# Patient Record
Sex: Female | Born: 1985 | Race: Black or African American | Hispanic: No | Marital: Married | Smoking: Former smoker
Health system: Southern US, Community
[De-identification: ages and names within clinical notes are randomized; demographics above are authoritative.]

## PROBLEM LIST (undated history)

## (undated) DIAGNOSIS — R51 Headache: Secondary | ICD-10-CM

## (undated) DIAGNOSIS — R600 Localized edema: Secondary | ICD-10-CM

## (undated) DIAGNOSIS — F32A Depression, unspecified: Secondary | ICD-10-CM

## (undated) DIAGNOSIS — F329 Major depressive disorder, single episode, unspecified: Secondary | ICD-10-CM

## (undated) DIAGNOSIS — R519 Headache, unspecified: Secondary | ICD-10-CM

## (undated) DIAGNOSIS — Z8709 Personal history of other diseases of the respiratory system: Secondary | ICD-10-CM

## (undated) DIAGNOSIS — L309 Dermatitis, unspecified: Secondary | ICD-10-CM

## (undated) DIAGNOSIS — F419 Anxiety disorder, unspecified: Secondary | ICD-10-CM

## (undated) DIAGNOSIS — J45909 Unspecified asthma, uncomplicated: Secondary | ICD-10-CM

## (undated) DIAGNOSIS — I10 Essential (primary) hypertension: Secondary | ICD-10-CM

## (undated) HISTORY — DX: Essential (primary) hypertension: I10

## (undated) HISTORY — PX: TONSILLECTOMY: SUR1361

## (undated) HISTORY — PX: LEEP: SHX91

---

## 2015-04-27 ENCOUNTER — Other Ambulatory Visit (HOSPITAL_COMMUNITY): Payer: Self-pay | Admitting: Surgery

## 2015-05-26 ENCOUNTER — Ambulatory Visit (HOSPITAL_COMMUNITY): Payer: BLUE CROSS/BLUE SHIELD

## 2015-06-04 ENCOUNTER — Ambulatory Visit (HOSPITAL_COMMUNITY)
Admission: RE | Admit: 2015-06-04 | Discharge: 2015-06-04 | Disposition: A | Discharge status: Home / Self Care | Payer: BLUE CROSS/BLUE SHIELD | Source: Ambulatory Visit | Attending: Surgery | Admitting: Surgery

## 2015-06-04 DIAGNOSIS — Z6841 Body Mass Index (BMI) 40.0 and over, adult: Secondary | ICD-10-CM | POA: Insufficient documentation

## 2015-06-05 ENCOUNTER — Encounter: Payer: BLUE CROSS/BLUE SHIELD | Attending: Surgery | Admitting: Dietician

## 2015-06-05 ENCOUNTER — Encounter: Payer: Self-pay | Admitting: Dietician

## 2015-06-05 DIAGNOSIS — Z029 Encounter for administrative examinations, unspecified: Secondary | ICD-10-CM | POA: Insufficient documentation

## 2015-06-05 NOTE — Progress Notes (Signed)
  Pre-Op Assessment Visit:  Pre-Operative RYGB Surgery  Medical Nutrition Therapy:  Appt start time: 0810   End time:  0900.  Patient was seen on 06/05/2015 for Pre-Operative Nutrition Assessment. Assessment and [facility] on 06/05/2015.   Patient is concerned about recent weight gain. She reports that she is struggling with lower extremity swelling. Has an appointment to see her PCP this spring to discuss swelling and Lasix. Also recently started Depo shot after having a baby 6 months ago.   Samples provided and patient instructed on proper use: Premier protein shake (chocolate - qty 1) Lot#: [id number] Exp: 01/2016  Premier protein shake (vanilla - qty 1) Lot#: [id number] Exp: 09/2015  Preferred Learning Style:   No preference indicated   Learning Readiness:   Ready  Handouts given during visit include:  Pre-Op Goals Bariatric Surgery Protein Shakes   During the appointment today the following Pre-Op Goals were reviewed with the patient: Maintain or lose weight as instructed by your surgeon Make healthy food choices Begin to limit portion sizes Limited concentrated sugars and fried foods Keep fat/sugar in the single digits per serving on   food labels Practice CHEWING your food  (aim for 30 chews per bite or until applesauce consistency) Practice not drinking 15 minutes before, during, and 30 minutes after each meal/snack Avoid all carbonated beverages  Avoid/limit caffeinated beverages  Avoid all sugar-sweetened beverages Consume 3 meals per day; eat every 3-5 hours Make a list of non-food related activities Aim for 64-100 ounces of FLUID daily  Aim for at least 60-80 grams of PROTEIN daily Look for a liquid protein source that contain ?15 g protein and ?5 g carbohydrate  (ex: shakes, drinks, shots)  Patient-Centered Goals: -Amusement parks -Self confidence -Fitting in chairs (not being  embarrassed) -Feeling better overall -Longer, healthier life  Scale of 1-10: confidence (10) /importance(10)  Demonstrated degree of understanding via:  Teach Back  Teaching Method Utilized:  Visual Auditory Hands on  Barriers to learning/adherence to lifestyle change: none  Patient to call the [facility] to enroll in Pre-Op and Post-Op Nutrition Education when surgery date is scheduled.

## 2015-06-05 NOTE — Patient Instructions (Signed)
Follow Pre-Op Goals Try Protein Shakes Call [facility] at [phone] when surgery is scheduled to enroll in Pre-Op Class  Things to remember:  Please always be honest with us. We want to support you!  If you have any questions or concerns in between appointments, please call or email Liz, Matty Vanroekel, or Laurie.  The diet after surgery will be high protein and low in carbohydrate.  Vitamins and calcium need to be taken for the rest of your life.  Feel free to include support people in any classes or appointments.   Supplement recommendations:  "Complete" Multivitamin: Sleeve Gastrectomy and RYGB patients take a double dose of MVI. Vitamin must be liquid or chewable but not gummy. Examples of these include Flintstones Complete and Centrum Complete. If the vitamin is bariatric-specific, take 1 dose as it is already formulated for bariatric surgery patients. Examples of these are Bariatric Advantage, Celebrate, and Wellesse. These can be found at the [facility] and/or online.     Calcium citrate: 1500 mg/day of Calcium citrate (also chewable or liquid) is recommended for all procedures. The body is only able to absorb 500-600 mg of Calcium at one time so 3 daily doses of 500 mg are recommended. Calcium doses must be taken a minimum of 2 hours apart. Additionally, Calcium must be taken 2 hours apart from iron-containing MVI. Examples of brands include Celebrate, Bariatric Advantage, and Wellesse. These brands must be purchased online or at the  Outpatient Pharmacy. Citracal Petites is the only Calcium citrate supplement found in general grocery stores and pharmacies. This is in tablet form and may be recommended for patients who do not tolerate chewable Calcium.  Continued or added Vitamin D supplementation based on individual needs.    Vitamin B12: 300-500 mcg/day for Sleeve Gastrectomy and RYGB. Optional for LAGB patients as stomach remains fully intact. Must be taken  intramuscularly, sublingually, or inhaled nasally. Oral route is not recommended.  

## 2015-06-28 ENCOUNTER — Ambulatory Visit: Payer: Self-pay | Admitting: Dietician

## 2015-07-05 ENCOUNTER — Other Ambulatory Visit: Payer: Self-pay | Admitting: Surgery

## 2015-07-07 ENCOUNTER — Encounter: Payer: BLUE CROSS/BLUE SHIELD | Attending: Surgery | Admitting: Dietician

## 2015-07-07 ENCOUNTER — Other Ambulatory Visit: Payer: Self-pay | Admitting: Surgery

## 2015-07-07 ENCOUNTER — Encounter (HOSPITAL_COMMUNITY): Payer: Self-pay

## 2015-07-07 ENCOUNTER — Encounter (HOSPITAL_COMMUNITY)
Admission: RE | Admit: 2015-07-07 | Discharge: 2015-07-07 | Disposition: A | Discharge status: Home / Self Care | Payer: BLUE CROSS/BLUE SHIELD | Source: Ambulatory Visit | Attending: Surgery | Admitting: Surgery

## 2015-07-07 ENCOUNTER — Encounter: Payer: Self-pay | Admitting: Dietician

## 2015-07-07 DIAGNOSIS — Z6841 Body Mass Index (BMI) 40.0 and over, adult: Secondary | ICD-10-CM | POA: Diagnosis not present

## 2015-07-07 DIAGNOSIS — Z01812 Encounter for preprocedural laboratory examination: Secondary | ICD-10-CM | POA: Insufficient documentation

## 2015-07-07 DIAGNOSIS — K8020 Calculus of gallbladder without cholecystitis without obstruction: Secondary | ICD-10-CM | POA: Diagnosis not present

## 2015-07-07 HISTORY — DX: Anxiety disorder, unspecified: F41.9

## 2015-07-07 HISTORY — DX: Dermatitis, unspecified: L30.9

## 2015-07-07 HISTORY — DX: Depression, unspecified: F32.A

## 2015-07-07 HISTORY — DX: Headache, unspecified: R51.9

## 2015-07-07 HISTORY — DX: Headache: R51

## 2015-07-07 HISTORY — DX: Major depressive disorder, single episode, unspecified: F32.9

## 2015-07-07 HISTORY — DX: Personal history of other diseases of the respiratory system: Z87.09

## 2015-07-07 HISTORY — DX: Unspecified asthma, uncomplicated: J45.909

## 2015-07-07 HISTORY — DX: Localized edema: R60.0

## 2015-07-07 LAB — COMPREHENSIVE METABOLIC PANEL
ALBUMIN: 4.3 g/dL (ref 3.5–5.0)
ALT: 60 U/L — AB (ref 14–54)
AST: 27 U/L (ref 15–41)
Alkaline Phosphatase: 85 U/L (ref 38–126)
Anion gap: 11 (ref 5–15)
BUN: 30 mg/dL — AB (ref 6–20)
CHLORIDE: 103 mmol/L (ref 101–111)
CO2: 26 mmol/L (ref 22–32)
CREATININE: 1.16 mg/dL — AB (ref 0.44–1.00)
Calcium: 9.7 mg/dL (ref 8.9–10.3)
GFR calc Af Amer: >60 mL/min (ref >60)
GFR calc non Af Amer: >60 mL/min (ref >60)
GLUCOSE: 97 mg/dL (ref 65–99)
Potassium: 4.1 mmol/L (ref 3.5–5.1)
SODIUM: 140 mmol/L (ref 135–145)
Total Bilirubin: 0.9 mg/dL (ref 0.3–1.2)
Total Protein: 8.7 g/dL — ABNORMAL HIGH (ref 6.5–8.1)

## 2015-07-07 LAB — CBC WITH DIFFERENTIAL/PLATELET
BASOS ABS: 0 10*3/uL (ref 0.0–0.1)
Basophils Relative: 0 %
EOS ABS: 0.1 10*3/uL (ref 0.0–0.7)
EOS PCT: 1 %
HCT: 40.9 % (ref 36.0–46.0)
HEMOGLOBIN: 13.2 g/dL (ref 12.0–15.0)
Lymphocytes Relative: 31 %
Lymphs Abs: 2.3 10*3/uL (ref 0.7–4.0)
MCH: 23.8 pg — ABNORMAL LOW (ref 26.0–34.0)
MCHC: 32.3 g/dL (ref 30.0–36.0)
MCV: 73.8 fL — ABNORMAL LOW (ref 78.0–100.0)
Monocytes Absolute: 0.4 10*3/uL (ref 0.1–1.0)
Monocytes Relative: 5 %
NEUTROS PCT: 63 %
Neutro Abs: 4.7 10*3/uL (ref 1.7–7.7)
PLATELETS: 321 10*3/uL (ref 150–400)
RBC: 5.54 MIL/uL — AB (ref 3.87–5.11)
RDW: 15.1 % (ref 11.5–15.5)
WBC: 7.5 10*3/uL (ref 4.0–10.5)

## 2015-07-07 NOTE — Progress Notes (Signed)
  Pre-Operative Nutrition Class:  Appt start time: 1035   End time:  1130  Patient was seen on 07/07/2015 for Pre-Operative Bariatric Surgery Education at the [facility].   Surgery date: 07/19/2015 Surgery type: RYGB Start weight at [facility]: 390 lbs Weight today: 374.4 lbs   Samples given per MNT protocol. Patient educated on appropriate usage:  Bariatric Advantage Calcium Citrate chew (qty 2 - chocolate) Lot #: [id number] Exp: 02/2016  Bariatric Advantage Calcium Citrate chew (qty 2 - strawberry) Lot #: [id number] Exp: 03/2016  Bariatric Advantage Calcium Citrate chew (qty 2 - peanut butter chocolate) Lot #: [id number] Exp: 03/2016  Bariatric Advantage Calcium Citrate chew (qty 2 - caramel) Lot #: [id number] Exp: 02/2016    The following the learning objectives were met by the patient during this course:  Identify Pre-Op Dietary Goals and will begin 2 weeks pre-operatively  Identify appropriate sources of fluids and proteins   State protein recommendations and appropriate sources pre and post-operatively  Identify Post-Operative Dietary Goals and will follow for 2 weeks post-operatively  Identify appropriate multivitamin and calcium sources  Describe the need for physical activity post-operatively and will follow MD recommendations  State when to call healthcare provider regarding medication questions or post-operative complications  Handouts given during class include:  Pre-Op Bariatric Surgery Diet Handout  Protein Shake Handout  Post-Op Bariatric Surgery Nutrition Handout  BELT Program Information Flyer  Support Group Information Flyer  [facility] Bariatric Supplements Price List  Follow-Up Plan: Patient will follow-up at [facility] 2 weeks post operatively for diet advancement per MD.

## 2015-07-07 NOTE — Patient Instructions (Signed)
Pam BackerSara Collins  07/07/2015   Your procedure is scheduled on: Monday July 19, 2015   [facility]  Entrance [place]  elevators to 3rd floor to  Short Stay Center at 9:30 AM.  Call this number if you have problems the morning of surgery [phone]   Remember: ONLY 1 PERSON MAY GO WITH YOU TO SHORT STAY TO GET  READY MORNING OF YOUR SURGERY.  Do not eat food or drink liquids :After Midnight.     Take these medicines the morning of surgery with A SIP OF WATER: Bupropion (Wellbutrin); Citalopram (Celexa); Diazepam (Valium) if needed; Hydrocodone-Acetaminophen if needed                                You may not have any metal on your body including hair pins and              piercings  Do not wear jewelry, make-up, lotions, powders or perfumes, deodorant             Do not wear nail polish.  Do not shave  48 hours prior to surgery.              Do not bring valuables to the hospital. [facility] IS NOT             RESPONSIBLE   FOR VALUABLES.  Contacts, dentures or bridgework may not be worn into surgery.  Leave suitcase in the car. After surgery it may be brought to your room.       _____________________________________________________________________             [facility] - Preparing for [facility] Before surgery, you can play an important role.  Because skin is not sterile, your skin needs to be as free of germs as possible.  You can reduce the number of germs on your skin by washing with CHG (chlorahexidine gluconate) soap before surgery.  CHG is an antiseptic cleaner which kills germs and bonds with the skin to continue killing germs even after washing. Please DO NOT use if you have an allergy to CHG or antibacterial soaps.  If your skin becomes reddened/irritated stop using the CHG and inform your nurse when you arrive at Short Stay. Do not shave (including legs and underarms) for at least 48 hours prior to the first CHG shower.  You may shave  your face/neck. Please follow these instructions carefully:  1.  Shower with CHG Soap the night before surgery and the  morning of Surgery.  2.  If you choose to wash your hair, wash your hair first as usual with your  normal  shampoo.  3.  After you shampoo, rinse your hair and body thoroughly to remove the  shampoo.                           4.  Use CHG as you would any other liquid soap.  You can apply chg directly  to the skin and wash                       Gently with a scrungie or clean washcloth.  5.  Apply the CHG Soap to your body ONLY FROM THE NECK DOWN.   Do not use on face/ open  Wound or open sores. Avoid contact with eyes, ears mouth and genitals (private parts).                       Wash face,  Genitals (private parts) with your normal soap.             6.  Wash thoroughly, paying special attention to the area where your surgery  will be performed.  7.  Thoroughly rinse your body with warm water from the neck down.  8.  DO NOT shower/wash with your normal soap after using and rinsing off  the CHG Soap.                9.  Pat yourself dry with a clean towel.            10.  Wear clean pajamas.            11.  Place clean sheets on your bed the night of your first shower and do not  sleep with pets. Day of Surgery : Do not apply any lotions/deodorants the morning of surgery.  Please wear clean clothes to the hospital/surgery center.  FAILURE TO FOLLOW THESE INSTRUCTIONS MAY RESULT IN THE CANCELLATION OF YOUR SURGERY PATIENT SIGNATURE_________________________________  NURSE SIGNATURE__________________________________  ________________________________________________________________________

## 2015-07-08 LAB — NO BLOOD PRODUCTS

## 2015-07-08 NOTE — Progress Notes (Signed)
EKG epic 06/04/2015; also placed EKG from 01/27/2015 on chart

## 2015-07-08 NOTE — Progress Notes (Addendum)
Notified Dr Allene PyoNewman's office of blood refusal. Spoke  With Press photographerCrystal/triage nurse. Also faxed refusal form to Dr Ezzard StandingNewman.

## 2015-07-08 NOTE — Progress Notes (Signed)
Faxed blood refusal to blood bank

## 2015-07-08 NOTE — Progress Notes (Signed)
Spoke with Amy in portable equipment in regards for need for bari bed. Order placed.

## 2015-07-08 NOTE — Progress Notes (Signed)
CMP results in epic per PAT visit 07/07/2015 sent to Dr Raelyn Mora Newman

## 2015-07-18 NOTE — H&P (Signed)
Levada Dy [facility]  Location: [facility] Patient #: [id number] DOB: Jun 02, 1985 Married / Language: English / Race: White Female  History of Present Illness   Patient words: morbid obesity.   The patient is a 30 year old female who presents for a bariatric surgery evaluation.   Her PCP is Dr. Mathis Fare in [place].   She saw a surgeon - Dr. Era Bumpers in [place] for gastric bypass. But they were not approved on her insurance. She comes [facility]. She was seen there in 2016 for RYGB. She completed a 6 months supervised weight loss.  Her husband Remi Deter is with her. He is sleepy. From her standpoint, he may have been somewhat disengaged. But he asked good questions. I am going to give him the EMMI video to look at. I reviewed the surgery with her. I gave her a book on gall bladder surgery. I reviewed the operation, the recovery, and the post op course. Because she is coming from the [place], I would be likely to keep her a little longer in the hospital. I used the flip chart to show her the operation.  We also talked about limiting NSAIDs after the surgery. This may impact the treatment of her migraine headaches. She is meeting with Lawson Fiscal after the meets with me.  Her UGI on 06/04/2015 - negative. She is scheduled for surgery on 07/20/2015.  She has been on our Futures trader. She has been overweight since third grade. She has tried multiple diets including: Weight Watchers, Atkins diet, Curves, low-carb diet, calorie watching, and working out and exercise. She's tried medication such as phentermine which led to chest pain, so she stopped this. She been on Wellbutrin, which helped initially, but then did not. She says she has weakness for carbs and sweets. She has decided on the gastric bypass. She does know a few people whom have had that operation.  Per the 1991 NIH Consensus Statement, the patient  is a candidate for bariatric surgery. The patient attended an information session and reviewed the types of bariatric surgery.  The patient is interested in the Roux en Y Gastric Bypass. I discussed with the patient the indications and risks of bariatric surgery. The potential risks of surgery include, but are not limited to, bleeding, infection, leak from the bowel, DVT and PE, open surgery, long term nutrition consequences, and death. The patient understands the importance of compliance and long term follow-up with our group after surgery. She has already had much of her work up at [facility].   I discussed with the patient the indications and risks of gall bladder surgery. I gave her a copy of her [place] report. The primary risks of gall bladder surgery include, but are not limited to, bleeding, infection, common bile duct injury, and open surgery. There is also the risk that the patient may have continued symptoms after surgery.  I again gave the patient a boolklet about gall bladder surgery. Her total time for surgery will be 4 to 5 hours.   Seen by psych - Donzetta Kohut, Psy D, in [place] - 03/04/2105 She has over 100 pages of records from [place].  Past Medical History 1. Chronic back pain and knee pain  She is not actively seeing anyone at this time. 2. Migraine She gets these weekly. She said that all the women in her family get these. She also relates them to stress. When she takes the amytriptyline, they are better controlled. 3. Anxiety/depression Treated by her PCP  4. HTN 5. Jehovah's witness Refuses blood products She has left paper work with the hospital about this. She will take albumin. 6. Gall stone - 1.8 cm on US. 7. 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 BuPROPion HCl ER (XL) (300MG  Tablet ER 24HR, Oral) Active. Amitriptyline HCl (50MG  Tablet, Oral) Active. Amitriptyline HCl (25MG  Tablet, Oral) Active. Ambien (5MG  Tablet, Oral) Active. Flexeril (5MG  Tablet, Oral) Active. CeleXA (10MG  Tablet, Oral) Active. Valium (2MG  Tablet, Oral) Active. Lasix (20MG  Tablet, Oral) Active. Hydrocodone-Acetaminophen (7.5-325MG  Tablet, Oral prn) Active. TraMADol HCl (50MG  Tablet, Oral prn) Active. Ibuprofen (600MG  Tablet, Oral prn) Active. Medications Reconciled  Vitals Cyndra Numbers(Bernie Morris CMA; 07/07/2015 12:12 PM) 07/07/2015 12:12 PM Weight: 374.2 lb Height: 69in Body Surface Area: 2.7 m Body Mass Index: 55.26 kg/m  Temp.: 98.75F(Oral)  Pulse: 100 (Regular)  BP: 128/86 (Sitting, Left Arm, Standard)   Physical Exam  General: Obese WF alert and generally healthy appearing. HEENT: Normal. Pupils equal.  Neck: Supple. No mass. No thyroid mass.  Lymph Nodes: No supraclavicular or cervical nodes.  Lungs: Clear to auscultation and symmetric breath sounds. Heart: RRR. No murmur or rub.  Abdomen: Soft. No mass. No tenderness. No hernia. Normal bowel sounds. No abdominal scars.   She is about 1/2 apple and about 1/2 pear. She has a lot of weight in her thighs.  Extremities: Good strength and ROM in upper and lower extremities.   Neurologic: Grossly intact to motor and sensory function. Psychiatric: Has normal mood and affect. Behavior is normal.  Assessment & Plan  1.  MORBID OBESITY WITH BMI OF 50.0-59.9, ADULT (E66.01)  Plan:  1) She has prescriptions for oxycodone, Zofran,  and Protonix.  2) She is given the bowel prep sheet.  3) Scheduled for RYGB/cholecystectomy - 07/19/2015  2.  GALLSTONES (K80.20)  Impression: Plan to do cholecystectomy at the same time as gastric bypass 3.  REFUSAL OF BLOOD TRANSFUSIONS AS PATIENT IS JEHOVAH'S WITNESS (Z53.1)  Impression: She has given advanced directives to the hospital 4.  HISTORY OF MIGRAINE HEADACHES (Z86.69)  Impression: She gets these weekly. 5.  Chronic back pain and knee pain 6.  Anxiety/depression   Treated by her PCP 7. HTN   Ovidio Kinavid Reese Senk, MD, [facility] Pager: [phone] Office phone:  [phone]

## 2015-07-19 ENCOUNTER — Inpatient Hospital Stay (HOSPITAL_COMMUNITY): Payer: BLUE CROSS/BLUE SHIELD | Admitting: Certified Registered Nurse Anesthetist

## 2015-07-19 ENCOUNTER — Encounter (HOSPITAL_COMMUNITY)
Admission: RE | Disposition: A | Discharge status: Home / Self Care | Payer: Self-pay | Source: Ambulatory Visit | Attending: Surgery

## 2015-07-19 ENCOUNTER — Encounter (HOSPITAL_COMMUNITY): Payer: Self-pay | Admitting: *Deleted

## 2015-07-19 ENCOUNTER — Inpatient Hospital Stay (HOSPITAL_COMMUNITY): Payer: BLUE CROSS/BLUE SHIELD

## 2015-07-19 ENCOUNTER — Inpatient Hospital Stay (HOSPITAL_COMMUNITY)
Admission: RE | Admit: 2015-07-19 | Discharge: 2015-07-22 | DRG: 620 | Disposition: A | Discharge status: Home / Self Care | Payer: BLUE CROSS/BLUE SHIELD | Source: Ambulatory Visit | Attending: Surgery | Admitting: Surgery

## 2015-07-19 DIAGNOSIS — Z531 Procedure and treatment not carried out because of patient's decision for reasons of belief and group pressure: Secondary | ICD-10-CM | POA: Diagnosis present

## 2015-07-19 DIAGNOSIS — Z9884 Bariatric surgery status: Secondary | ICD-10-CM

## 2015-07-19 DIAGNOSIS — F329 Major depressive disorder, single episode, unspecified: Secondary | ICD-10-CM | POA: Diagnosis present

## 2015-07-19 DIAGNOSIS — G8929 Other chronic pain: Secondary | ICD-10-CM | POA: Diagnosis present

## 2015-07-19 DIAGNOSIS — K8010 Calculus of gallbladder with chronic cholecystitis without obstruction: Secondary | ICD-10-CM | POA: Diagnosis present

## 2015-07-19 DIAGNOSIS — M25569 Pain in unspecified knee: Secondary | ICD-10-CM | POA: Diagnosis present

## 2015-07-19 DIAGNOSIS — Z87891 Personal history of nicotine dependence: Secondary | ICD-10-CM

## 2015-07-19 DIAGNOSIS — R609 Edema, unspecified: Secondary | ICD-10-CM | POA: Diagnosis not present

## 2015-07-19 DIAGNOSIS — G43909 Migraine, unspecified, not intractable, without status migrainosus: Secondary | ICD-10-CM | POA: Diagnosis present

## 2015-07-19 DIAGNOSIS — M549 Dorsalgia, unspecified: Secondary | ICD-10-CM | POA: Diagnosis present

## 2015-07-19 DIAGNOSIS — Z6841 Body Mass Index (BMI) 40.0 and over, adult: Secondary | ICD-10-CM

## 2015-07-19 DIAGNOSIS — Z79899 Other long term (current) drug therapy: Secondary | ICD-10-CM

## 2015-07-19 DIAGNOSIS — F419 Anxiety disorder, unspecified: Secondary | ICD-10-CM | POA: Diagnosis present

## 2015-07-19 DIAGNOSIS — I10 Essential (primary) hypertension: Secondary | ICD-10-CM | POA: Diagnosis present

## 2015-07-19 DIAGNOSIS — R21 Rash and other nonspecific skin eruption: Secondary | ICD-10-CM | POA: Diagnosis present

## 2015-07-19 DIAGNOSIS — Z419 Encounter for procedure for purposes other than remedying health state, unspecified: Secondary | ICD-10-CM

## 2015-07-19 HISTORY — PX: GASTRIC ROUX-EN-Y: SHX5262

## 2015-07-19 HISTORY — PX: CHOLECYSTECTOMY: SHX55

## 2015-07-19 LAB — HEMOGLOBIN AND HEMATOCRIT, BLOOD
HEMATOCRIT: 38 % (ref 36.0–46.0)
HEMOGLOBIN: 12.3 g/dL (ref 12.0–15.0)

## 2015-07-19 LAB — PREGNANCY, URINE: PREG TEST UR: NEGATIVE

## 2015-07-19 SURGERY — LAPAROSCOPIC ROUX-EN-Y GASTRIC BYPASS WITH UPPER ENDOSCOPY
Anesthesia: General | Site: Abdomen

## 2015-07-19 MED ORDER — LACTATED RINGERS IR SOLN
Status: DC | PRN
  Administered 2015-07-19 (×4): 1000 mL

## 2015-07-19 MED ORDER — LACTATED RINGERS IV SOLN
INTRAVENOUS | Status: DC
  Administered 2015-07-19 (×3): via INTRAVENOUS

## 2015-07-19 MED ORDER — PROMETHAZINE HCL 25 MG/ML IJ SOLN
6.2500 mg | Freq: Once | INTRAMUSCULAR | Status: AC | PRN
  Administered 2015-07-19: 6.25 mg via INTRAVENOUS

## 2015-07-19 MED ORDER — ONDANSETRON HCL 4 MG/2ML IJ SOLN
INTRAMUSCULAR | Status: AC
  Filled 2015-07-19: qty 2

## 2015-07-19 MED ORDER — LACTATED RINGERS IV SOLN: INTRAVENOUS | Status: DC

## 2015-07-19 MED ORDER — CEFOTETAN DISODIUM-DEXTROSE 2-2.08 GM-% IV SOLR
INTRAVENOUS | Status: AC
  Filled 2015-07-19: qty 50

## 2015-07-19 MED ORDER — SUGAMMADEX SODIUM 200 MG/2ML IV SOLN
INTRAVENOUS | Status: DC | PRN
  Administered 2015-07-19: 500 mg via INTRAVENOUS

## 2015-07-19 MED ORDER — HEPARIN SODIUM (PORCINE) 5000 UNIT/ML IJ SOLN
5000.0000 [IU] | Freq: Three times a day (TID) | INTRAMUSCULAR | Status: DC
  Administered 2015-07-19 – 2015-07-22 (×8): 5000 [IU] via SUBCUTANEOUS
  Filled 2015-07-19 (×12): qty 1

## 2015-07-19 MED ORDER — ROCURONIUM BROMIDE 100 MG/10ML IV SOLN
INTRAVENOUS | Status: AC
  Filled 2015-07-19: qty 1

## 2015-07-19 MED ORDER — SUCCINYLCHOLINE CHLORIDE 20 MG/ML IJ SOLN
INTRAMUSCULAR | Status: DC | PRN
Start: 2015-07-19 — End: 2015-07-19
  Administered 2015-07-19: 100 mg via INTRAVENOUS

## 2015-07-19 MED ORDER — PROPOFOL 10 MG/ML IV BOLUS
INTRAVENOUS | Status: AC
  Filled 2015-07-19: qty 20

## 2015-07-19 MED ORDER — GLYCOPYRROLATE 0.2 MG/ML IJ SOLN
INTRAMUSCULAR | Status: DC | PRN
  Administered 2015-07-19: 0.2 mg via INTRAVENOUS

## 2015-07-19 MED ORDER — LIDOCAINE HCL (CARDIAC) 20 MG/ML IV SOLN
INTRAVENOUS | Status: DC | PRN
  Administered 2015-07-19: 100 mg via INTRAVENOUS

## 2015-07-19 MED ORDER — HYDROMORPHONE HCL 1 MG/ML IJ SOLN: 0.2500 mg | INTRAMUSCULAR | Status: DC | PRN

## 2015-07-19 MED ORDER — PROMETHAZINE HCL 25 MG/ML IJ SOLN
INTRAMUSCULAR | Status: AC
  Filled 2015-07-19: qty 1

## 2015-07-19 MED ORDER — MORPHINE SULFATE (PF) 2 MG/ML IV SOLN
2.0000 mg | INTRAVENOUS | Status: DC | PRN
  Administered 2015-07-19: 4 mg via INTRAVENOUS
  Administered 2015-07-19: 6 mg via INTRAVENOUS
  Administered 2015-07-19: 4 mg via INTRAVENOUS
  Administered 2015-07-19: 6 mg via INTRAVENOUS
  Administered 2015-07-19: 4 mg via INTRAVENOUS
  Administered 2015-07-20 (×10): 6 mg via INTRAVENOUS
  Administered 2015-07-21 (×3): 4 mg via INTRAVENOUS
  Administered 2015-07-21: 6 mg via INTRAVENOUS
  Administered 2015-07-21: 4 mg via INTRAVENOUS
  Administered 2015-07-21 – 2015-07-22 (×3): 6 mg via INTRAVENOUS
  Filled 2015-07-19 (×2): qty 3
  Filled 2015-07-19: qty 2
  Filled 2015-07-19 (×3): qty 3
  Filled 2015-07-19: qty 2
  Filled 2015-07-19 (×3): qty 3
  Filled 2015-07-19: qty 2
  Filled 2015-07-19 (×4): qty 3
  Filled 2015-07-19: qty 2
  Filled 2015-07-19: qty 3
  Filled 2015-07-19: qty 2
  Filled 2015-07-19 (×2): qty 3
  Filled 2015-07-19: qty 2
  Filled 2015-07-19: qty 3
  Filled 2015-07-19: qty 2
  Filled 2015-07-19: qty 3

## 2015-07-19 MED ORDER — SODIUM CHLORIDE 0.9 % IJ SOLN
INTRAMUSCULAR | Status: AC
  Filled 2015-07-19: qty 10

## 2015-07-19 MED ORDER — HYDROMORPHONE HCL 1 MG/ML IJ SOLN
INTRAMUSCULAR | Status: DC
Start: 2015-07-19 — End: 2015-07-19
  Filled 2015-07-19: qty 1

## 2015-07-19 MED ORDER — ACETAMINOPHEN 160 MG/5ML PO SOLN
650.0000 mg | ORAL | Status: DC | PRN
  Administered 2015-07-22: 650 mg via ORAL
  Filled 2015-07-19: qty 20.3

## 2015-07-19 MED ORDER — PHENYLEPHRINE HCL 10 MG/ML IJ SOLN
INTRAMUSCULAR | Status: DC | PRN
  Administered 2015-07-19: 40 ug via INTRAVENOUS

## 2015-07-19 MED ORDER — MIDAZOLAM HCL 2 MG/2ML IJ SOLN
INTRAMUSCULAR | Status: AC
  Filled 2015-07-19: qty 2

## 2015-07-19 MED ORDER — IOPAMIDOL (ISOVUE-300) INJECTION 61%
INTRAVENOUS | Status: DC | PRN
  Administered 2015-07-19: 10 mL

## 2015-07-19 MED ORDER — ACETAMINOPHEN 160 MG/5ML PO SOLN: 325.0000 mg | ORAL | Status: DC | PRN

## 2015-07-19 MED ORDER — FENTANYL CITRATE (PF) 100 MCG/2ML IJ SOLN
INTRAMUSCULAR | Status: DC | PRN
  Administered 2015-07-19 (×10): 50 ug via INTRAVENOUS

## 2015-07-19 MED ORDER — ONDANSETRON HCL 4 MG/2ML IJ SOLN: 4.0000 mg | Freq: Once | INTRAMUSCULAR | Status: DC

## 2015-07-19 MED ORDER — DEXAMETHASONE SODIUM PHOSPHATE 10 MG/ML IJ SOLN
INTRAMUSCULAR | Status: AC
  Filled 2015-07-19: qty 1

## 2015-07-19 MED ORDER — CHLORHEXIDINE GLUCONATE 4 % EX LIQD: 60.0000 mL | Freq: Once | CUTANEOUS | Status: DC

## 2015-07-19 MED ORDER — TISSEEL VH 10 ML EX KIT
PACK | CUTANEOUS | Status: AC
  Filled 2015-07-19: qty 3

## 2015-07-19 MED ORDER — PROPOFOL 10 MG/ML IV BOLUS
INTRAVENOUS | Status: DC | PRN
  Administered 2015-07-19: 200 mg via INTRAVENOUS

## 2015-07-19 MED ORDER — FENTANYL CITRATE (PF) 100 MCG/2ML IJ SOLN
INTRAMUSCULAR | Status: AC
  Filled 2015-07-19: qty 2

## 2015-07-19 MED ORDER — ONDANSETRON HCL 4 MG/2ML IJ SOLN
4.0000 mg | INTRAMUSCULAR | Status: DC | PRN
  Administered 2015-07-19 – 2015-07-21 (×6): 4 mg via INTRAVENOUS
  Filled 2015-07-19 (×8): qty 2

## 2015-07-19 MED ORDER — DEXAMETHASONE SODIUM PHOSPHATE 10 MG/ML IJ SOLN
INTRAMUSCULAR | Status: DC | PRN
  Administered 2015-07-19: 10 mg via INTRAVENOUS

## 2015-07-19 MED ORDER — LIDOCAINE HCL (CARDIAC) 20 MG/ML IV SOLN
INTRAVENOUS | Status: AC
  Filled 2015-07-19: qty 5

## 2015-07-19 MED ORDER — OXYCODONE HCL 5 MG/5ML PO SOLN
5.0000 mg | ORAL | Status: DC | PRN
  Administered 2015-07-20 – 2015-07-22 (×5): 10 mg via ORAL
  Filled 2015-07-19 (×5): qty 10

## 2015-07-19 MED ORDER — SODIUM CHLORIDE 0.9 % IJ SOLN
INTRAMUSCULAR | Status: AC
Start: 2015-07-19 — End: 2015-07-19
  Filled 2015-07-19: qty 50

## 2015-07-19 MED ORDER — EPHEDRINE SULFATE 50 MG/ML IJ SOLN
INTRAMUSCULAR | Status: AC
  Filled 2015-07-19: qty 1

## 2015-07-19 MED ORDER — BUPIVACAINE HCL (PF) 0.25 % IJ SOLN
INTRAMUSCULAR | Status: AC
Start: 2015-07-19 — End: 2015-07-19
  Filled 2015-07-19: qty 30

## 2015-07-19 MED ORDER — PHENYLEPHRINE 40 MCG/ML (10ML) SYRINGE FOR IV PUSH (FOR BLOOD PRESSURE SUPPORT)
PREFILLED_SYRINGE | INTRAVENOUS | Status: AC
  Filled 2015-07-19: qty 10

## 2015-07-19 MED ORDER — ACETAMINOPHEN 10 MG/ML IV SOLN
INTRAVENOUS | Status: AC
  Filled 2015-07-19: qty 100

## 2015-07-19 MED ORDER — SUGAMMADEX SODIUM 500 MG/5ML IV SOLN
INTRAVENOUS | Status: AC
  Filled 2015-07-19: qty 5

## 2015-07-19 MED ORDER — FENTANYL CITRATE (PF) 250 MCG/5ML IJ SOLN
INTRAMUSCULAR | Status: AC
  Filled 2015-07-19: qty 5

## 2015-07-19 MED ORDER — HYDROMORPHONE HCL 1 MG/ML IJ SOLN
INTRAMUSCULAR | Status: DC | PRN
Start: 2015-07-19 — End: 2015-07-19
  Administered 2015-07-19: .2 mg via INTRAVENOUS
  Administered 2015-07-19: .4 mg via INTRAVENOUS
  Administered 2015-07-19: .2 mg via INTRAVENOUS
  Administered 2015-07-19 (×3): .4 mg via INTRAVENOUS

## 2015-07-19 MED ORDER — TISSEEL VH 10 ML EX KIT
PACK | CUTANEOUS | Status: DC | PRN
  Administered 2015-07-19: 3 via TOPICAL

## 2015-07-19 MED ORDER — IOPAMIDOL (ISOVUE-300) INJECTION 61%
INTRAVENOUS | Status: AC
  Filled 2015-07-19: qty 50

## 2015-07-19 MED ORDER — ONDANSETRON HCL 4 MG/2ML IJ SOLN
INTRAMUSCULAR | Status: DC | PRN
  Administered 2015-07-19: 4 mg via INTRAVENOUS

## 2015-07-19 MED ORDER — CEFOTETAN DISODIUM-DEXTROSE 2-2.08 GM-% IV SOLR
2.0000 g | INTRAVENOUS | Status: AC
  Administered 2015-07-19: 2 g via INTRAVENOUS

## 2015-07-19 MED ORDER — HEPARIN SODIUM (PORCINE) 5000 UNIT/ML IJ SOLN
5000.0000 [IU] | INTRAMUSCULAR | Status: AC
  Administered 2015-07-19: 5000 [IU] via SUBCUTANEOUS
  Filled 2015-07-19: qty 1

## 2015-07-19 MED ORDER — POTASSIUM CHLORIDE IN NACL 20-0.45 MEQ/L-% IV SOLN
INTRAVENOUS | Status: DC
  Administered 2015-07-19 – 2015-07-21 (×5): via INTRAVENOUS
  Filled 2015-07-19 (×12): qty 1000

## 2015-07-19 MED ORDER — ACETAMINOPHEN 10 MG/ML IV SOLN
1000.0000 mg | Freq: Once | INTRAVENOUS | Status: AC
  Administered 2015-07-19: 1000 mg via INTRAVENOUS

## 2015-07-19 MED ORDER — PROMETHAZINE HCL 25 MG/ML IJ SOLN
6.2500 mg | Freq: Once | INTRAMUSCULAR | Status: AC
  Administered 2015-07-19: 6.25 mg via INTRAVENOUS

## 2015-07-19 MED ORDER — PREMIER PROTEIN SHAKE
2.0000 [oz_av] | Freq: Four times a day (QID) | ORAL | Status: DC
  Administered 2015-07-21 – 2015-07-22 (×6): 2 [oz_av] via ORAL

## 2015-07-19 MED ORDER — ROCURONIUM BROMIDE 100 MG/10ML IV SOLN
INTRAVENOUS | Status: DC | PRN
  Administered 2015-07-19 (×2): 10 mg via INTRAVENOUS
  Administered 2015-07-19: 40 mg via INTRAVENOUS
  Administered 2015-07-19 (×2): 10 mg via INTRAVENOUS
  Administered 2015-07-19: 20 mg via INTRAVENOUS
  Administered 2015-07-19 (×3): 10 mg via INTRAVENOUS

## 2015-07-19 MED ORDER — 0.9 % SODIUM CHLORIDE (POUR BTL) OPTIME
TOPICAL | Status: DC | PRN
  Administered 2015-07-19: 1000 mL

## 2015-07-19 MED ORDER — MIDAZOLAM HCL 5 MG/5ML IJ SOLN
INTRAMUSCULAR | Status: DC | PRN
  Administered 2015-07-19: 2 mg via INTRAVENOUS
  Administered 2015-07-19: 1 mg via INTRAVENOUS

## 2015-07-19 MED ORDER — GLYCOPYRROLATE 0.2 MG/ML IJ SOLN
INTRAMUSCULAR | Status: AC
  Filled 2015-07-19: qty 1

## 2015-07-19 MED ORDER — HYDROMORPHONE HCL 2 MG/ML IJ SOLN
INTRAMUSCULAR | Status: AC
  Filled 2015-07-19: qty 1

## 2015-07-19 SURGICAL SUPPLY — 93 items
APPLIER CLIP 5 13 M/L LIGAMAX5 (MISCELLANEOUS)
APPLIER CLIP ROT 10 11.4 M/L (STAPLE)
APPLIER CLIP ROT 13.4 12 LRG (CLIP)
BENZOIN TINCTURE PRP APPL 2/3 (GAUZE/BANDAGES/DRESSINGS) ×3 IMPLANT
BLADE SURG 15 STRL LF DISP TIS (BLADE) ×1 IMPLANT
BLADE SURG 15 STRL SS (BLADE) ×2
CABLE HIGH FREQUENCY MONO STRZ (ELECTRODE) ×3 IMPLANT
CHLORAPREP W/TINT 26ML (MISCELLANEOUS) ×6 IMPLANT
CHOLANGIOGRAM CATH TAUT (CATHETERS) ×3 IMPLANT
CLIP APPLIE 5 13 M/L LIGAMAX5 (MISCELLANEOUS) IMPLANT
CLIP APPLIE ROT 10 11.4 M/L (STAPLE) IMPLANT
CLIP APPLIE ROT 13.4 12 LRG (CLIP) IMPLANT
CLIP SUT LAPRA TY ABSORB (SUTURE) ×6 IMPLANT
CLOSURE WOUND 1/4X4 (GAUZE/BANDAGES/DRESSINGS) ×1
CONT SPEC 4OZ CLIKSEAL STRL BL (MISCELLANEOUS) ×6 IMPLANT
COVER MAYO STAND STRL (DRAPES) IMPLANT
COVER SURGICAL LIGHT HANDLE (MISCELLANEOUS) ×3 IMPLANT
DECANTER SPIKE VIAL GLASS SM (MISCELLANEOUS) ×3 IMPLANT
DEVICE SUT QUICK LOAD TK 5 (STAPLE) IMPLANT
DEVICE SUT TI-KNOT TK 5X26 (MISCELLANEOUS) IMPLANT
DEVICE SUTURE ENDOST 10MM (ENDOMECHANICALS) ×3 IMPLANT
DEVICE TI KNOT TK5 (MISCELLANEOUS)
DISSECTOR BLUNT TIP ENDO 5MM (MISCELLANEOUS) IMPLANT
DRAIN PENROSE 18X1/4 LTX STRL (WOUND CARE) ×3 IMPLANT
DRAPE C-ARM 42X120 X-RAY (DRAPES) IMPLANT
DRAPE LAPAROSCOPIC ABDOMINAL (DRAPES) ×3 IMPLANT
ELECT REM PT RETURN 9FT ADLT (ELECTROSURGICAL) ×3
ELECTRODE REM PT RTRN 9FT ADLT (ELECTROSURGICAL) ×1 IMPLANT
GAUZE SPONGE 4X4 16PLY XRAY LF (GAUZE/BANDAGES/DRESSINGS) ×3 IMPLANT
GLOVE SURG SIGNA 7.5 PF LTX (GLOVE) ×3 IMPLANT
GOWN STRL REUS W/TWL XL LVL3 (GOWN DISPOSABLE) ×12 IMPLANT
HEMOSTAT SURGICEL 4X8 (HEMOSTASIS) IMPLANT
HOVERMATT SINGLE USE (MISCELLANEOUS) ×3 IMPLANT
IV CATH 14GX2 1/4 (CATHETERS) ×3 IMPLANT
IV SET EXTENSION CATH 6 NF (IV SETS) ×3 IMPLANT
KIT BASIN OR (CUSTOM PROCEDURE TRAY) ×3 IMPLANT
KIT GASTRIC LAVAGE 34FR ADT (SET/KITS/TRAYS/PACK) ×3 IMPLANT
LIQUID BAND (GAUZE/BANDAGES/DRESSINGS) ×3 IMPLANT
LUBRICANT JELLY K Y 4OZ (MISCELLANEOUS) ×3 IMPLANT
MARKER SKIN DUAL TIP RULER LAB (MISCELLANEOUS) ×3 IMPLANT
NEEDLE SPNL 22GX3.5 QUINCKE BK (NEEDLE) ×3 IMPLANT
PACK CARDIOVASCULAR III (CUSTOM PROCEDURE TRAY) ×3 IMPLANT
POSITIONER SURGICAL ARM (MISCELLANEOUS) IMPLANT
POUCH SPECIMEN RETRIEVAL 10MM (ENDOMECHANICALS) IMPLANT
QUICK LOAD TK 5 (STAPLE)
RELOAD 45 VASCULAR/THIN (ENDOMECHANICALS) ×6 IMPLANT
RELOAD ENDO STITCH 2.0 (ENDOMECHANICALS) ×22
RELOAD GOLD ECHELON 45 (STAPLE) ×6 IMPLANT
RELOAD STAPLE TA45 3.5 REG BLU (ENDOMECHANICALS) ×12 IMPLANT
RELOAD STAPLER BLUE 60MM (STAPLE) ×2 IMPLANT
RELOAD STAPLER GOLD 60MM (STAPLE) ×1 IMPLANT
RELOAD STAPLER WHITE 60MM (STAPLE) IMPLANT
SCISSORS LAP 5X35 DISP (ENDOMECHANICALS) ×3 IMPLANT
SEALANT SURGICAL APPL DUAL CAN (MISCELLANEOUS) ×3 IMPLANT
SET IRRIG TUBING LAPAROSCOPIC (IRRIGATION / IRRIGATOR) ×3 IMPLANT
SHEARS HARMONIC ACE PLUS 36CM (ENDOMECHANICALS) ×3 IMPLANT
SLEEVE ADV FIXATION 12X100MM (TROCAR) IMPLANT
SLEEVE SURGEON STRL (DRAPES) ×3 IMPLANT
SLEEVE XCEL OPT CAN 5 100 (ENDOMECHANICALS) ×3 IMPLANT
SOLUTION ANTI FOG 6CC (MISCELLANEOUS) ×3 IMPLANT
STAPLE ECHEON FLEX 60 POW ENDO (STAPLE) ×3 IMPLANT
STAPLER RELOAD BLUE 60MM (STAPLE) ×6
STAPLER RELOAD GOLD 60MM (STAPLE) ×3
STAPLER RELOAD WHITE 60MM (STAPLE)
STOPCOCK 4 WAY LG BORE MALE ST (IV SETS) ×3 IMPLANT
STRIP CLOSURE SKIN 1/4X4 (GAUZE/BANDAGES/DRESSINGS) ×2 IMPLANT
SUT MNCRL AB 4-0 PS2 18 (SUTURE) ×6 IMPLANT
SUT RELOAD ENDO STITCH 2 48X1 (ENDOMECHANICALS) ×7
SUT RELOAD ENDO STITCH 2.0 (ENDOMECHANICALS) ×4
SUT SURGIDAC NAB ES-9 0 48 120 (SUTURE) IMPLANT
SUT VIC AB 2-0 SH 27 (SUTURE) ×2
SUT VIC AB 2-0 SH 27X BRD (SUTURE) ×1 IMPLANT
SUTURE RELOAD END STTCH 2 48X1 (ENDOMECHANICALS) ×7 IMPLANT
SUTURE RELOAD ENDO STITCH 2.0 (ENDOMECHANICALS) ×4 IMPLANT
SYR 10ML ECCENTRIC (SYRINGE) ×3 IMPLANT
SYR 20CC LL (SYRINGE) ×6 IMPLANT
SYR 50ML LL SCALE MARK (SYRINGE) ×3 IMPLANT
TAPE CLOTH 4X10 WHT NS (GAUZE/BANDAGES/DRESSINGS) IMPLANT
TOWEL OR 17X26 10 PK STRL BLUE (TOWEL DISPOSABLE) ×3 IMPLANT
TRAY FOLEY W/METER SILVER 14FR (SET/KITS/TRAYS/PACK) ×3 IMPLANT
TRAY LAPAROSCOPIC (CUSTOM PROCEDURE TRAY) ×3 IMPLANT
TROCAR ADV FIXATION 11X100MM (TROCAR) IMPLANT
TROCAR ADV FIXATION 12X100MM (TROCAR) IMPLANT
TROCAR ADV FIXATION 5X100MM (TROCAR) IMPLANT
TROCAR BLADELESS OPT 5 100 (ENDOMECHANICALS) ×3 IMPLANT
TROCAR UNIVERSAL OPT 12M 100M (ENDOMECHANICALS) IMPLANT
TROCAR XCEL 12X100 BLDLESS (ENDOMECHANICALS) IMPLANT
TROCAR XCEL BLUNT TIP 100MML (ENDOMECHANICALS) ×3 IMPLANT
TROCAR XCEL NON-BLD 11X100MML (ENDOMECHANICALS) ×3 IMPLANT
TUBING CONNECTING 10 (TUBING) IMPLANT
TUBING CONNECTING 10' (TUBING)
TUBING ENDO SMARTCAP (MISCELLANEOUS) ×3 IMPLANT
TUBING INSUF HEATED (TUBING) ×3 IMPLANT

## 2015-07-19 NOTE — Op Note (Signed)
PATIENT:   Pam Collins DOB:   Jul 30, 1985 MRN:   [id number]  DATE OF PROCEDURE: 07/19/2015                   FACILITY:  [facility]  OPERATIVE REPORT  PREOPERATIVE DIAGNOSIS:  Morbid obesity, cholelithiasis  POSTOPERATIVE DIAGNOSIS:  Morbid obesity (weight 374, BMI of 55.3), cholelithiasis.  PROCEDURE:  Laparoscopic Roux-en-Y gastric bypass, antecolic, antegastric (intraoperative upper endoscopy by Dr. Fredonia Highland), laparoscopic cholecystectomy with cholangiogram  SURGEON:  Sandria Bales. Ezzard Standing, MD  FIRST ASSISTANTFredonia Highland, MD  ANESTHESIA:  General endotracheal.  Anesthesiologist: Sharee Holster, MD CRNA: Wynonia Sours, CRNA; Delphia Grates, CRNA  General  ESTIMATED BLOOD LOSS:  Minimal.  LOCAL ANESTHESIA:  30 cc of 1/4% Marcaine  COMPLICATIONS:  None.  INDICATION FOR SURGERY:  Pam Collins is a 30 y.o. white  female who sees AUSTIN, ANN E, DO as her primary care doctor.  She has completed our preoperative bariatric program and now comes for a laparoscopic Roux-en-Y gastric bypass.  On her pre op [place], she has gall stone.  I discussed with her about going ahead with a cholecystectomy at the same time as the gastric bypass.  The indications, potential complications of surgery were explained to the patient.  Potential complications of the surgery include, but are not limited to, bleeding, infection, DVT, open surgery, and long-term nutritional consequences.  OPERATIVE NOTE:  The patient taken to room #4 at [facility] where Ms. Sealy underwent a general endotracheal anesthetic, supervised by Anesthesiologist: Sharee Holster, MD CRNA: Wynonia Sours, CRNA; Delphia Grates, CRNA.  The patient was given 2 g of cefotetan at the beginning of the procedure.  A time-out was held and surgical checklist run.  The abdomen was prepped with ChloraPrep and sterilely draped.  I accessed the abdominal cavity through the left upper quadrant using a 12 mm Optiview trocar.  I placed 6 additional trocars: 5 mm  subxiphoid, 12 mm right subcostal, 12 mm right paramedian, 12 mm left paramedian, 5 mm lateral subcostal, and a 11 mm below to the right of the umbilicus.  The abdomen was insufflated and abdominal exploration carried out.  Right and left lobes of liver unremarkable.  The stomach that I could see was unremarkable.  The patient had a moderate amount of greater omentum which draped over the bowel.  I was able to push the omentum and transverse colon up and identified the ligament of Treitz to start the operation.  I measured 40 cm of the jejunum, starting at the ligament of Tritz, and divided the jejunum with a white load of 45 mm Ethicon Endo-GIA stapler.  I divided a short length into the mesentery.  I measured 100 cm of jejunum for the future gastric limb.  I put a Penrose drain on the future gastric limb of the jejunum.  I then did a side-to-side jejunojejunostomy.  I used a 45 mm white load of the Ethicon Endo-GIA stapler.  I closed the enterotomy with 2 running 2-0 Vicryl sutures.  I tested the JJ anastomosis with an alligator forceps and then covered this with Tisseel.  I closed the mesenteric defect with a running 2-0 silk suture with a Laparo-tye on each end.  I then divided the omentum with a Harmonic Scalpel.  I positioned the patient in reverse Trendelenburg and placed the liver retractor, which was introduced into the peritoneal cavity through a subxiphoid 5 mm trocar puncture, under the left lobe of the liver.  I then identified  the gastroesophageal junction.  I went to the left at the angle of His and made a window at the left side of the esophago-gastric junction for a target as my dissection.  I then went on the lesser curve of the stomach, measured 5 cm from the gastroesophageal junction down the lesser curve and dissected into the lesser sac from the lesser curvature side of the stomach.  I did the first firing of a 60 mm Gold load Ethicon Eschelon stapler and then did 3 firings of  the 60 mm blue load Ethicon Eschelon stapler.  I had to do 2 additional firings of a 45 mm Ethicon Endo GIA stapler to complete divide the stomach.  This created a gastric pouch approximately 5 cm in length and 3 cm in width.  There was no bleeding from either the pouch or the stomach remnant site.  I placed Tisseel on the pouch side along the new greater curvature.  I over sewed the gastric remnant with a locking 2-0 Vicryl suture with a Laparo-tye on each end..  I then brought the jejunum ante-colic, ante-gastric up to the new stomach pouch and placed a posterior running 2-0 Vicryl suture.  I then made an enterotomy into the stomach using the Ewald as a back stop and an enterotomy into the jejunum.  I did a stapled side-to-side gastrojejunal anastomosis using these two enterotomies with a 45 mm blue load of the Ethicon Endo GIA stapler.  I tried to create a 2.5 cm gastrojejunal anastomosis.  I closed the enterotomy with a 2 running 2-0 Vicryl sutures.  I passed the Ewald tube through the gastrojejunal anastomosis and then did an anterior Connell suture running of 2-0 Vicryl suture for the anterior layer of the gastrojejunostomy.  She did a fairly long afferent limb of jejunum at the gastrojejunal anastomosis.  The Ewald tube was then removed without difficulty.  I then closed the [place] defect with a figure-of-eight 2-0 silk suture between the mesentery of the transverse colon and the mesentery of the distal jejunum.  Dr. Andrey Campanile then scrubbed out and did an intraoperative upper endoscopy.  He identified the esophagogastric junction about 40 cm, the gastrojejunal anastomosis about 45 cm.  I clamped off the small bowel.  He insufflated air and I flooded the abdomen with saline. There was no bubbling or evidence of air leak.  He then withdrew the scope and he will dictate that portion of the operation.    I then re-inspected the anastomoses, sucked out the saline, placed Tisseel over the stomach pouch  and gastrojejunal anastomosis.   The liver retractor was removed.     I then started the gall bladder part of the operation.  Dr. Andrey Campanile placed a right lateral 5 mm trocar to grab the gall bladder, otherwise, I was able to use the puncture sites already created.  The gall bladder was not inflamed.   I grasped the gall bladder and rotated it cephalad.  Disssection was carried down to the gall bladder/cystic duct junction and the cystic duct isolated.  A clip was placed on the gall bladder side of the cystic duct.   An intra-operative cholangiogram was shot.  The intra-operative cholangiogram was shot using a cut off Taut catheter placed through a 14 gauge angiocath in the RUQ.  The Taut catheter was inserted in the cut cystic duct and secured with an endoclip.  A cholangiogram was shot with 10 cc of 1/2 strength Omnipaque.  Using fluoroscopy, the cholangiogram showed the flow  of contrast into the common bile duct, up the hepatic radicals, and into the duodenum.  There was no mass or obstruction.  This was a normal intra-operative cholangiogram.  The Taut catheter was removed.  The cystic duct was tripley endoclipped and the cystic artery was identified and clipped.  The gall bladder was bluntly and sharpley dissected from the gall bladder bed.  After the gall bladder was removed from the liver, the gall bladder bed and Triangle of [place] were inspected.  There was no bleeding or bile leak.  The gall bladder was placed in a endocatch bag and delivered through the RUQ trocar.    The abdomen was irrigated with 3,000 cc saline  The trocars were removed.  There was no bleeding at any trocar site.  The skin at each trocar site was closed with a 4-0 Monocryl suture.  I infiltrated a total about 30 cc of 0.25% Marcaine at the trocar sites.    After the skin incisions were closed with sutures they were painted with LiquidBand.  The sponge and needle count were correct at the end of the case.  The patient  tolerated the procedure well, was transported to the recovery room in good condition.   Ovidio Kinavid Kaliq Lege, MD, [facility] Pager: [phone] Office phone:  [phone]

## 2015-07-19 NOTE — Anesthesia Postprocedure Evaluation (Signed)
Anesthesia Post Note  Patient: Pam Collins  Procedure(s) Performed: Procedure(s) (LRB): LAPAROSCOPIC ROUX-EN-Y GASTRIC BYPASS WITH UPPER ENDOSCOPY (N/A) LAPAROSCOPIC CHOLECYSTECTOMY WITH INTRAOPERATIVE CHOLANGIOGRAM (N/A)  Patient location during evaluation: PACU Anesthesia Type: General Level of consciousness: awake and alert Pain management: pain level controlled Vital Signs Assessment: post-procedure vital signs reviewed and stable Respiratory status: spontaneous breathing, nonlabored ventilation, respiratory function stable and patient connected to nasal cannula oxygen Cardiovascular status: blood pressure returned to baseline and stable Postop Assessment: no signs of nausea or vomiting Anesthetic complications: no    Last Vitals:  Filed Vitals:   07/19/15 1630 07/19/15 1649  BP: 131/82 149/77  Pulse: 114 114  Temp: 36.8 C 36.9 C  Resp: 22 22    Last Pain:  Filed Vitals:   07/19/15 1649  PainSc: Asleep                 Leesha Veno,JAMES TERRILL

## 2015-07-19 NOTE — Anesthesia Procedure Notes (Addendum)
Procedure Name: Intubation Date/Time: 07/19/2015 11:27 AM Performed by: Wynonia SoursWALKER, Siarra Gilkerson L Pre-anesthesia Checklist: Patient identified, Emergency Drugs available, Suction available, Patient being monitored and Timeout performed Patient Re-evaluated:Patient Re-evaluated prior to inductionOxygen Delivery Method: Circle system utilized Preoxygenation: Pre-oxygenation with 100% oxygen Intubation Type: IV induction Ventilation: Mask ventilation without difficulty Laryngoscope Size: Mac and 4 Grade View: Grade I Tube type: Oral Tube size: 7.0 mm Number of attempts: 1 Airway Equipment and Method: Stylet Placement Confirmation: ETT inserted through vocal cords under direct vision,  positive ETCO2,  CO2 detector and breath sounds checked- equal and bilateral Secured at: 22 cm Tube secured with: Tape Dental Injury: Teeth and Oropharynx as per pre-operative assessment  Comments: During preop assessment patient has chipped front tooth in which she stated that a filling fell out.

## 2015-07-19 NOTE — Anesthesia Preprocedure Evaluation (Addendum)
Anesthesia Evaluation  Patient identified by MRN, date of birth, ID band Patient awake    Reviewed: Allergy & Precautions, NPO status , Patient's Chart, lab work & pertinent test results  Airway Mallampati: II  TM Distance: >3 FB Neck ROM: Full    Dental  (+) Teeth Intact, Poor Dentition, Dental Advisory Given,    Pulmonary asthma , former smoker,    breath sounds clear to auscultation       Cardiovascular hypertension,  Rhythm:Regular Rate:Normal     Neuro/Psych  Headaches,    GI/Hepatic negative GI ROS, Neg liver ROS,   Endo/Other  Morbid obesity  Renal/GU negative Renal ROS     Musculoskeletal   Abdominal (+) + obese,   Peds  Hematology Pt is Jehovah's witness and  Refuses blood products   Anesthesia Other Findings   Reproductive/Obstetrics                            Anesthesia Physical Anesthesia Plan  ASA: III  Anesthesia Plan: General   Post-op Pain Management:    Induction: Intravenous  Airway Management Planned: Oral ETT  Additional Equipment:   Intra-op Plan:   Post-operative Plan: Extubation in OR  Informed Consent:   Dental advisory given  Plan Discussed with:   Anesthesia Plan Comments: (Noted Rica Koyanagijehovahs' witness)        Anesthesia Quick Evaluation

## 2015-07-19 NOTE — Interval H&P Note (Signed)
History and Physical Interval Note:  07/19/2015 10:58 AM  Pam BackerSara Shrader  has presented today for surgery, with the diagnosis of MORBID OBESITY  The various methods of treatment have been discussed with the patient and family. Her husband and mother are here with her. After consideration of risks, benefits and other options for treatment, the patient has consented to  Procedure(s): LAPAROSCOPIC ROUX-EN-Y GASTRIC BYPASS WITH UPPER ENDOSCOPY (N/A) LAPAROSCOPIC CHOLECYSTECTOMY WITH INTRAOPERATIVE CHOLANGIOGRAM (N/A) as a surgical intervention .  The patient's history has been reviewed, patient examined, no change in status, stable for surgery.  I have reviewed the patient's chart and labs.  Questions were answered to the patient's satisfaction.     Marshaun Lortie H

## 2015-07-19 NOTE — Op Note (Signed)
Ciro BackerSara Estrella [id number] [birth year] 07/19/2015  Preoperative diagnosis: morbid obesity  Postoperative diagnosis: Same   Procedure: Upper endoscopy   Surgeon: Atilano InaEric M Karia Ehresman M.D., FACS   Anesthesia: Gen.   Indications for procedure: 30 y.o. yo female undergoing a laparoscopic roux en y gastric bypass and an upper endoscopy was requested to evaluate the anastomosis.  Description of procedure: After we have completed the new gastrojejunostomy, I scrubbed out and obtained the Olympus endoscope. I gently placed endoscope in the patient's oropharynx and gently glided it down the esophagus without any difficulty under direct visualization. Once I was in the gastric pouch, I insufflated the pouch was air. The pouch was approximately 4 cm (z line at 40cm, anastomosis at 44cm) in size. I was able to cannulate and advanced the scope through the gastrojejunostomy. Dr.Newman had placed saline in the upper abdomen. Upon further insufflation of the gastric pouch there was no evidence of bubbles. Upon further inspection of the gastric pouch, the mucosa appeared normal. There is no evidence of any mucosal abnormality. The gastric pouch and Roux limb were decompressed. The width of the gastrojejunal anastomosis was at least 2.5 cm. The scope was withdrawn. The patient tolerated this portion of the procedure well. Please see Dr Allene PyoNewman's operative note for details regarding the laparoscopic roux-en-y gastric bypass.  Mary SellaEric M. Andrey CampanileWilson, MD, FACS General, Bariatric, & Minimally Invasive Surgery [facility], [place]

## 2015-07-19 NOTE — Transfer of Care (Signed)
Immediate Anesthesia Transfer of Care Note  Patient: Ciro BackerSara Luginbill  Procedure(s) Performed: Procedure(s): LAPAROSCOPIC ROUX-EN-Y GASTRIC BYPASS WITH UPPER ENDOSCOPY (N/A) LAPAROSCOPIC CHOLECYSTECTOMY WITH INTRAOPERATIVE CHOLANGIOGRAM (N/A)  Patient Location: PACU  Anesthesia Type:General  Level of Consciousness:  sedated, patient cooperative and responds to stimulation  Airway & Oxygen Therapy:Patient Spontanous Breathing and Patient connected to face mask oxgen  Post-op Assessment:  Report given to PACU RN and Post -op Vital signs reviewed and stable  Post vital signs:  Reviewed and stable  Last Vitals:  Filed Vitals:   07/19/15 1540 07/19/15 1545  BP: 156/94   Pulse: 124 121  Temp:    Resp:  20    Complications: No apparent anesthesia complications

## 2015-07-20 ENCOUNTER — Inpatient Hospital Stay (HOSPITAL_COMMUNITY): Payer: BLUE CROSS/BLUE SHIELD

## 2015-07-20 DIAGNOSIS — R609 Edema, unspecified: Secondary | ICD-10-CM

## 2015-07-20 LAB — CBC WITH DIFFERENTIAL/PLATELET
BASOS ABS: 0 10*3/uL (ref 0.0–0.1)
BASOS PCT: 0 %
Eosinophils Absolute: 0 10*3/uL (ref 0.0–0.7)
Eosinophils Relative: 0 %
HEMATOCRIT: 34.7 % — AB (ref 36.0–46.0)
HEMOGLOBIN: 11.4 g/dL — AB (ref 12.0–15.0)
LYMPHS PCT: 18 %
Lymphs Abs: 2 10*3/uL (ref 0.7–4.0)
MCH: 23.7 pg — ABNORMAL LOW (ref 26.0–34.0)
MCHC: 32.9 g/dL (ref 30.0–36.0)
MCV: 72.1 fL — AB (ref 78.0–100.0)
Monocytes Absolute: 0.5 10*3/uL (ref 0.1–1.0)
Monocytes Relative: 5 %
NEUTROS ABS: 8.5 10*3/uL — AB (ref 1.7–7.7)
NEUTROS PCT: 77 %
Platelets: 277 10*3/uL (ref 150–400)
RBC: 4.81 MIL/uL (ref 3.87–5.11)
RDW: 15.5 % (ref 11.5–15.5)
WBC: 11 10*3/uL — ABNORMAL HIGH (ref 4.0–10.5)

## 2015-07-20 LAB — HEMOGLOBIN AND HEMATOCRIT, BLOOD
HEMATOCRIT: 36.7 % (ref 36.0–46.0)
Hemoglobin: 11.9 g/dL — ABNORMAL LOW (ref 12.0–15.0)

## 2015-07-20 MED ORDER — DIATRIZOATE MEGLUMINE & SODIUM 66-10 % PO SOLN
50.0000 mL | Freq: Once | ORAL | Status: DC
  Filled 2015-07-20: qty 60

## 2015-07-20 MED ORDER — ZOLPIDEM TARTRATE 5 MG PO TABS
5.0000 mg | ORAL_TABLET | Freq: Every evening | ORAL | Status: DC | PRN
  Administered 2015-07-20 – 2015-07-21 (×2): 5 mg via ORAL
  Filled 2015-07-20 (×2): qty 1

## 2015-07-20 MED ORDER — DIAZEPAM 5 MG/ML IJ SOLN
5.0000 mg | Freq: Two times a day (BID) | INTRAMUSCULAR | Status: DC | PRN
  Administered 2015-07-20 – 2015-07-21 (×3): 5 mg via INTRAVENOUS
  Filled 2015-07-20 (×3): qty 2

## 2015-07-20 MED ORDER — CITALOPRAM HYDROBROMIDE 20 MG PO TABS
20.0000 mg | ORAL_TABLET | Freq: Every day | ORAL | Status: DC
  Administered 2015-07-20 – 2015-07-22 (×3): 20 mg via ORAL
  Filled 2015-07-20 (×3): qty 1

## 2015-07-20 MED ORDER — BUPROPION HCL ER (XL) 300 MG PO TB24
300.0000 mg | ORAL_TABLET | Freq: Every day | ORAL | Status: DC
  Administered 2015-07-20 – 2015-07-22 (×3): 300 mg via ORAL
  Filled 2015-07-20 (×3): qty 1

## 2015-07-20 MED ORDER — AMITRIPTYLINE HCL 50 MG PO TABS
50.0000 mg | ORAL_TABLET | Freq: Every day | ORAL | Status: DC
  Administered 2015-07-20 – 2015-07-21 (×2): 50 mg via ORAL
  Filled 2015-07-20 (×4): qty 1

## 2015-07-20 NOTE — Progress Notes (Signed)
[facility] Office:  [phone] General Surgery Progress Note   LOS: 1 day  POD -  1 Day Post-Op  Assessment/Plan: 1.  LAPAROSCOPIC ROUX-EN-Y GASTRIC BYPASS WITH UPPER ENDOSCOPY, LAPAROSCOPIC CHOLECYSTECTOMY WITH INTRAOPERATIVE CHOLANGIOGRAM - 07/19/2015 - D. Donnella Morford  Morbid obesity - Weight - 374, BMI - 55.3  Doing okay so far - for swallow today  2. REFUSAL OF BLOOD TRANSFUSIONS AS PATIENT IS JEHOVAH'S WITNESS (Z53.1) Impression: She has given advanced directives to the hospital 3. HISTORY OF MIGRAINE HEADACHES (Z86.69) Impression: She gets these weekly. 4. Chronic back pain and knee pain 5. Anxiety/depression  Treated by her PCP 6. HTN 7.  DVT prophylaxis - SQ Heparin 8.  Rash left lower leg - she was scratching it last night   Active Problems:   Morbid obesity (HCC)  Subjective:  Thirsty, but no nausea.  Walked a lot last PM.  Her mother and husband are at the bedside.  Objective:   Filed Vitals:   07/20/15 0226 07/20/15 0604  BP: 137/80 145/80  Pulse: 83 77  Temp: 97.9 F (36.6 C) 97.4 F (36.3 C)  Resp: 18 16     Intake/Output from previous day:  04/17 0701 - 04/18 0700 In: 3895 [I.V.:3895] Out: 2065 [Urine:2015; Blood:50]  Intake/Output this shift:      Physical Exam:   General: Obese WF who is alert and oriented.    HEENT: Normal. Pupils equal. .   Lungs: Clear   Abdomen: Soft.  Has a few BS.   Wound: Clean.   Left lower leg - superficial rash anteriorly, nothing on right side.   Lab Results:    Recent Labs  07/19/15 1750 07/20/15 0431  WBC  --  11.0*  HGB 12.3 11.4*  HCT 38.0 34.7*  PLT  --  277    BMET  No results for input(s): NA, K, CL, CO2, GLUCOSE, BUN, CREATININE, CALCIUM in the last 72 hours.  PT/INR  No results for input(s): LABPROT, INR in the last 72 hours.  ABG  No results for input(s): PHART, HCO3 in the last 72 hours.  Invalid input(s): PCO2,  PO2   Studies/Results:  Dg Cholangiogram Operative  07/19/2015  CLINICAL DATA:  Intraoperative cholangiogram during laparoscopic cholecystectomy. EXAM: INTRAOPERATIVE CHOLANGIOGRAM FLUOROSCOPY TIME:  15 seconds (7.47 mGy) COMPARISON:  None FINDINGS: Intraoperative cholangiographic images of the right upper abdominal quadrant during laparoscopic cholecystectomy are provided for review. Surgical clips overlie the expected location of the gallbladder fossa. Contrast injection demonstrates selective cannulation of the central aspect of the cystic duct. There is passage of contrast through the central aspect of the cystic duct with filling of a non dilated common bile duct. There is passage of contrast though the CBD and into the descending portion of the duodenum. There is minimal reflux of injected contrast into the common hepatic duct and central aspect of the non dilated intrahepatic biliary system. There is minimal opacification of central aspect of the pancreatic duct which appears nondilated. There are no discrete filling defects within the opacified portions of the biliary system to suggest the presence of choledocholithiasis, however no, the distal aspect of the CBD is obscured secondary to overlying radiopaque surgical support apparatus. IMPRESSION: No definite evidence of choledocholithiasis. Electronically Signed   By: Simonne ComeJohn  Watts M.D.   On: 07/19/2015 15:14     Anti-infectives:   Anti-infectives    Start     Dose/Rate Route Frequency Ordered Stop   07/19/15 0939  cefoTEtan in Dextrose 5% (CEFOTAN) IVPB 2  g     2 g Intravenous On call to O.R. 07/19/15 0939 07/19/15 1147      Ovidio Kin, MD, FACS Pager: [phone] Surgery Office: [phone] 07/20/2015

## 2015-07-20 NOTE — Progress Notes (Signed)
Patient alert and oriented, Post op day 1.  Provided support and encouragement.  Encouraged pulmonary toilet, ambulation and small sips of liquids.  All questions answered.  Will continue to monitor. 

## 2015-07-20 NOTE — Progress Notes (Addendum)
*  Preliminary Results* Bilateral lower extremity venous duplex completed. Visualized veins of bilateral lower extremities are negative for deep vein thrombosis. There is no evidence of Baker's cyst bilaterally.  Incidental finding: There is evidence of a heterogenous area of the right groin measuring 1.1cm, suggestive of a possible inguinal lymph node.  07/20/2015  Gertie FeyMichelle Callyn Severtson, RVT, RDCS, RDMS

## 2015-07-20 NOTE — Progress Notes (Signed)
Reviewed pt requested home medications via phone with Dr. Johna SheriffHoxworth. MD aware pt passed UGI and tolerating water q4h as well as ice chips. See new orders received.

## 2015-07-20 NOTE — Plan of Care (Signed)
Problem: Food- and Nutrition-Related Knowledge Deficit (NB-1.1) Goal: Nutrition education Formal process to instruct or train a patient/client in a skill or to impart knowledge to help patients/clients voluntarily manage or modify food choices and eating behavior to maintain or improve health. Outcome: Completed/Met Date Met:  07/20/15 Nutrition Education Note  Received consult for diet education per DROP protocol.   Discussed 2 week post op diet with pt. Emphasized that liquids must be non carbonated, non caffeinated, and sugar free. Fluid goals discussed. Reviewed progression of diet to include soft proteins at 7-10 days post-op. Pt to follow up with outpatient bariatric RD for further diet progression after 2 weeks. Multivitamins and minerals also reviewed. Teach back method used, pt expressed understanding, expect good compliance.   Diet: First 2 Weeks  You will see the dietitian about two (2) weeks after your surgery. The dietitian will increase the types of foods you can eat if you are handling liquids well:  If you have severe vomiting or nausea and cannot handle clear liquids lasting longer than 1 day, call your surgeon  Protein Shake  Drink at least 2 ounces of shake 5-6 times per day  Each serving of protein shakes (usually 8 - 12 ounces) should have a minimum of:  15 grams of protein  And no more than 5 grams of carbohydrate  Goal for protein each day:  Men = 80 grams per day  Women = 60 grams per day  Protein powder may be added to fluids such as non-fat milk or Lactaid milk or Soy milk (limit to 35 grams added protein powder per serving)   Hydration  Slowly increase the amount of water and other clear liquids as tolerated (See Acceptable Fluids)  Slowly increase the amount of protein shake as tolerated  Sip fluids slowly and throughout the day  May use sugar substitutes in small amounts (no more than 6 - 8 packets per day; i.e. Splenda)   Fluid Goal  The first goal is to  drink at least 8 ounces of protein shake/drink per day (or as directed by the nutritionist); some examples of protein shakes are Syntrax Nectar, Adkins Advantage, EAS Edge HP, and Unjury. See handout from pre-op Bariatric Education Class:  Slowly increase the amount of protein shake you drink as tolerated  You may find it easier to slowly sip shakes throughout the day  It is important to get your proteins in first  Your fluid goal is to drink 64 - 100 ounces of fluid daily  It may take a few weeks to build up to this  32 oz (or more) should be clear liquids  And  32 oz (or more) should be full liquids (see below for examples)  Liquids should not contain sugar, caffeine, or carbonation   Clear Liquids:  Water or Sugar-free flavored water (i.e. Fruit H2O, Propel)  Decaffeinated coffee or tea (sugar-free)  Crystal Lite, Wyler's Lite, Minute Maid Lite  Sugar-free Jell-O  Bouillon or broth  Sugar-free Popsicle: *Less than 20 calories each; Limit 1 per day   Full Liquids:  Protein Shakes/Drinks + 2 choices per day of other full liquids  Full liquids must be:  No More Than 12 grams of Carbs per serving  No More Than 3 grams of Fat per serving  Strained low-fat cream soup  Non-Fat milk  Fat-free Lactaid Milk  Sugar-free yogurt (Dannon Lite & Fit, Greek yogurt)     Tuwanna Krausz, MS, RD, LDN Pager: [phone] After Hours Pager: [phone]        

## 2015-07-20 NOTE — Progress Notes (Signed)
Patient requested for by mouth medications which are amitriptyline and ambien. Dr Ezzard StandingNewman notified.  Patient remains NPO.

## 2015-07-21 LAB — CBC WITH DIFFERENTIAL/PLATELET
BASOS ABS: 0 10*3/uL (ref 0.0–0.1)
BASOS PCT: 0 %
Eosinophils Absolute: 0.1 10*3/uL (ref 0.0–0.7)
Eosinophils Relative: 1 %
HEMATOCRIT: 38.6 % (ref 36.0–46.0)
HEMOGLOBIN: 12.2 g/dL (ref 12.0–15.0)
Lymphocytes Relative: 38 %
Lymphs Abs: 3.8 10*3/uL (ref 0.7–4.0)
MCH: 23.8 pg — ABNORMAL LOW (ref 26.0–34.0)
MCHC: 31.6 g/dL (ref 30.0–36.0)
MCV: 75.4 fL — ABNORMAL LOW (ref 78.0–100.0)
MONO ABS: 0.5 10*3/uL (ref 0.1–1.0)
Monocytes Relative: 5 %
NEUTROS ABS: 5.7 10*3/uL (ref 1.7–7.7)
NEUTROS PCT: 56 %
Platelets: 305 10*3/uL (ref 150–400)
RBC: 5.12 MIL/uL — ABNORMAL HIGH (ref 3.87–5.11)
RDW: 16.2 % — AB (ref 11.5–15.5)
WBC: 10.1 10*3/uL (ref 4.0–10.5)

## 2015-07-21 NOTE — Progress Notes (Signed)
[facility]:  [phone] General Surgery Progress Note   LOS: 2 days  POD -  2 Days Post-Op  Assessment/Plan: 1.  LAPAROSCOPIC ROUX-EN-Y GASTRIC BYPASS WITH UPPER ENDOSCOPY, LAPAROSCOPIC CHOLECYSTECTOMY WITH INTRAOPERATIVE CHOLANGIOGRAM - 07/19/2015 - D. Analyse Angst  Morbid obesity - Weight - 374, BMI - 55.3  Swallow okay - taking water.  To advance diet.  2. REFUSAL OF BLOOD TRANSFUSIONS AS PATIENT IS JEHOVAH'S WITNESS (Z53.1) Impression: She has given advanced directives to the hospital 3. HISTORY OF MIGRAINE HEADACHES (Z86.69) Impression: She gets these weekly. 4. Chronic back pain and knee pain 5. Anxiety/depression  Treated by her PCP 6. HTN 7.  DVT prophylaxis - SQ Heparin 8.  Rash left lower leg - she was scratching it last night   Active Problems:   Morbid obesity (HCC)  Subjective:  Tolerated water.  Mobilizing well.  Husband at bedside.  Objective:   Filed Vitals:   07/21/15 0142 07/21/15 0552  BP: 117/58 142/61  Pulse: 92 90  Temp: 98.8 F (37.1 C) 98.9 F (37.2 C)  Resp: 18 18     Intake/Output from previous day:  04/18 0701 - 04/19 0700 In: 1230 [P.O.:30; I.V.:1200] Out: 1550 [Urine:1550]  Intake/Output this shift:      Physical Exam:   General: Obese WF who is alert and oriented.    HEENT: Normal. Pupils equal. .   Lungs: Clear   Abdomen: Soft.  Has BS.   Wound: Clean.   Left lower leg - superficial rash anteriorly, nothing on right side.   Lab Results:     Recent Labs  07/20/15 0431 07/20/15 1553 07/21/15 0421  WBC 11.0*  --  10.1  HGB 11.4* 11.9* 12.2  HCT 34.7* 36.7 38.6  PLT 277  --  305    BMET  No results for input(s): NA, K, CL, CO2, GLUCOSE, BUN, CREATININE, CALCIUM in the last 72 hours.  PT/INR  No results for input(s): LABPROT, INR in the last 72 hours.  ABG  No results for input(s): PHART, HCO3 in the last 72 hours.  Invalid input(s): PCO2,  PO2   Studies/Results:  Dg Cholangiogram Operative  07/19/2015  CLINICAL DATA:  Intraoperative cholangiogram during laparoscopic cholecystectomy. EXAM: INTRAOPERATIVE CHOLANGIOGRAM FLUOROSCOPY TIME:  15 seconds (7.47 mGy) COMPARISON:  None FINDINGS: Intraoperative cholangiographic images of the right upper abdominal quadrant during laparoscopic cholecystectomy are provided for review. Surgical clips overlie the expected location of the gallbladder fossa. Contrast injection demonstrates selective cannulation of the central aspect of the cystic duct. There is passage of contrast through the central aspect of the cystic duct with filling of a non dilated common bile duct. There is passage of contrast though the CBD and into the descending portion of the duodenum. There is minimal reflux of injected contrast into the common hepatic duct and central aspect of the non dilated intrahepatic biliary system. There is minimal opacification of central aspect of the pancreatic duct which appears nondilated. There are no discrete filling defects within the opacified portions of the biliary system to suggest the presence of choledocholithiasis, however no, the distal aspect of the CBD is obscured secondary to overlying radiopaque surgical support apparatus. IMPRESSION: No definite evidence of choledocholithiasis. Electronically Signed   By: Simonne ComeJohn  Watts M.D.   On: 07/19/2015 15:14   Dg Kayleen MemosUgi W/water Sol Cm  07/20/2015  CLINICAL DATA:  30 year old female post gastric bypass. Initial encounter. EXAM: WATER SOLUBLE UPPER GI SERIES TECHNIQUE: Single-column upper GI series was performed using water soluble contrast.  CONTRAST:  50 cc Gastroview COMPARISON:  06/04/2015 FLUOROSCOPY TIME:  Radiation Exposure Index (as provided by the fluoroscopic device): 258.3 micro Gray Fluoroscopy Time:  42 seconds. FINDINGS: Post gastric bypass procedure. No leak identified. No delayed emptying. IMPRESSION: Post gastric bypass procedure. No leak  identified. No delayed emptying. Electronically Signed   By: Lacy Duverney M.D.   On: 07/20/2015 10:09     Anti-infectives:   Anti-infectives    Start     Dose/Rate Route Frequency Ordered Stop   07/19/15 0939  cefoTEtan in Dextrose 5% (CEFOTAN) IVPB 2 g     2 g Intravenous On call to O.R. 07/19/15 0939 07/19/15 1147      Ovidio Kin, MD, FACS Pager: [phone] [facility]: [phone] 07/21/2015

## 2015-07-21 NOTE — Progress Notes (Signed)
Patient alert and oriented, Post op day 2.  Provided support and encouragement.  Encouraged pulmonary toilet, ambulation and small sips of liquids.  All questions answered.  Will continue to monitor. 

## 2015-07-22 NOTE — Discharge Summary (Addendum)
Physician Discharge Summary  Patient ID:  Pam Collins  MRN: [id number]  DOB/AGE: 1985-10-14 30 y.o.  Admit date: 07/19/2015 Discharge date: 07/22/2015  Discharge Diagnoses:  1.  Morbid obesity - Weight - 374, BMI - 55.3 2.  Chronic cholecystitis  3. REFUSAL OF BLOOD TRANSFUSIONS AS PATIENT IS JEHOVAH'S WITNESS (Z53.1) Impression: She has given advanced directives to the hospital 4. HISTORY OF MIGRAINE HEADACHES (Z86.69) Impression: She gets these weekly. 5. Chronic back pain and knee pain 6. Anxiety/depression  Treated by her PCP 7. HTN 8. Rash left lower leg - she was scratching it last night   Active Problems:   Morbid obesity (HCC)   Operation: Procedure(s): LAPAROSCOPIC ROUX-EN-Y GASTRIC BYPASS WITH UPPER ENDOSCOPY LAPAROSCOPIC CHOLECYSTECTOMY WITH INTRAOPERATIVE CHOLANGIOGRAM on 07/19/2015 - D. Ezzard StandingNewman  Discharged Condition: good  Hospital Course: Pam BackerSara Stanislawski is an 30 y.o. female whose primary care physician is AUSTIN, ANN E, DO and who was admitted 07/19/2015 with a chief complaint of morbid obesity and gallstones.  She was brought to the operating room on 07/19/2015 and underwent  LAPAROSCOPIC ROUX-EN-Y GASTRIC BYPASS WITH UPPER ENDOSCOPY LAPAROSCOPIC CHOLECYSTECTOMY WITH INTRAOPERATIVE CHOLANGIOGRAM.   On the first post op day, she had a UGI swallow which showed a normal pouch.  Dopplers of her LE were negative for DVT. She is now 3 days post op, on protein drinks and doing well. Her husband is at the bedside. She is ready for discharge. The discharge instructions were reviewed with the patient.  Consults: None  Significant Diagnostic Studies: Results for orders placed or performed during the hospital encounter of 07/19/15  Pregnancy, urine STAT morning of surgery  Result Value Ref Range   Preg Test, Ur NEGATIVE NEGATIVE  Hemoglobin and hematocrit, blood  Result Value Ref Range   Hemoglobin 12.3 12.0 - 15.0 g/dL   HCT  78.238.0 95.636.0 - 21.346.0 %  CBC WITH DIFFERENTIAL  Result Value Ref Range   WBC 11.0 (H) 4.0 - 10.5 K/uL   RBC 4.81 3.87 - 5.11 MIL/uL   Hemoglobin 11.4 (L) 12.0 - 15.0 g/dL   HCT 08.634.7 (L) 57.836.0 - 46.946.0 %   MCV 72.1 (L) 78.0 - 100.0 fL   MCH 23.7 (L) 26.0 - 34.0 pg   MCHC 32.9 30.0 - 36.0 g/dL   RDW 62.915.5 52.811.5 - 41.315.5 %   Platelets 277 150 - 400 K/uL   Neutrophils Relative % 77 %   Neutro Abs 8.5 (H) 1.7 - 7.7 K/uL   Lymphocytes Relative 18 %   Lymphs Abs 2.0 0.7 - 4.0 K/uL   Monocytes Relative 5 %   Monocytes Absolute 0.5 0.1 - 1.0 K/uL   Eosinophils Relative 0 %   Eosinophils Absolute 0.0 0.0 - 0.7 K/uL   Basophils Relative 0 %   Basophils Absolute 0.0 0.0 - 0.1 K/uL  Hemoglobin and hematocrit, blood  Result Value Ref Range   Hemoglobin 11.9 (L) 12.0 - 15.0 g/dL   HCT 24.436.7 01.036.0 - 27.246.0 %  CBC with Differential  Result Value Ref Range   WBC 10.1 4.0 - 10.5 K/uL   RBC 5.12 (H) 3.87 - 5.11 MIL/uL   Hemoglobin 12.2 12.0 - 15.0 g/dL   HCT 53.638.6 64.436.0 - 03.446.0 %   MCV 75.4 (L) 78.0 - 100.0 fL   MCH 23.8 (L) 26.0 - 34.0 pg   MCHC 31.6 30.0 - 36.0 g/dL   RDW 74.216.2 (H) 59.511.5 - 63.815.5 %   Platelets 305 150 - 400 K/uL   Neutrophils Relative % 56 %  Neutro Abs 5.7 1.7 - 7.7 K/uL   Lymphocytes Relative 38 %   Lymphs Abs 3.8 0.7 - 4.0 K/uL   Monocytes Relative 5 %   Monocytes Absolute 0.5 0.1 - 1.0 K/uL   Eosinophils Relative 1 %   Eosinophils Absolute 0.1 0.0 - 0.7 K/uL   Basophils Relative 0 %   Basophils Absolute 0.0 0.0 - 0.1 K/uL    Dg Cholangiogram Operative  07/19/2015  CLINICAL DATA:  Intraoperative cholangiogram during laparoscopic cholecystectomy. EXAM: INTRAOPERATIVE CHOLANGIOGRAM FLUOROSCOPY TIME:  15 seconds (7.47 mGy) COMPARISON:  None FINDINGS: Intraoperative cholangiographic images of the right upper abdominal quadrant during laparoscopic cholecystectomy are provided for review. Surgical clips overlie the expected location of the gallbladder fossa. Contrast injection demonstrates  selective cannulation of the central aspect of the cystic duct. There is passage of contrast through the central aspect of the cystic duct with filling of a non dilated common bile duct. There is passage of contrast though the CBD and into the descending portion of the duodenum. There is minimal reflux of injected contrast into the common hepatic duct and central aspect of the non dilated intrahepatic biliary system. There is minimal opacification of central aspect of the pancreatic duct which appears nondilated. There are no discrete filling defects within the opacified portions of the biliary system to suggest the presence of choledocholithiasis, however no, the distal aspect of the CBD is obscured secondary to overlying radiopaque surgical support apparatus. IMPRESSION: No definite evidence of choledocholithiasis. Electronically Signed   By: Simonne Come M.D.   On: 07/19/2015 15:14   Dg Kayleen Memos W/water Sol Cm  07/20/2015  CLINICAL DATA:  30 year old female post gastric bypass. Initial encounter. EXAM: WATER SOLUBLE UPPER GI SERIES TECHNIQUE: Single-column upper GI series was performed using water soluble contrast. CONTRAST:  50 cc Gastroview COMPARISON:  06/04/2015 FLUOROSCOPY TIME:  Radiation Exposure Index (as provided by the fluoroscopic device): 258.3 micro Gray Fluoroscopy Time:  42 seconds. FINDINGS: Post gastric bypass procedure. No leak identified. No delayed emptying. IMPRESSION: Post gastric bypass procedure. No leak identified. No delayed emptying. Electronically Signed   By: Lacy Duverney M.D.   On: 07/20/2015 10:09    Discharge Exam:  Filed Vitals:   07/22/15 0207 07/22/15 0614  BP: 114/64 150/83  Pulse: 99 96  Temp: 98.4 F (36.9 C) 98.3 F (36.8 C)  Resp: 17 18    General: Obese WF who is alert and generally healthy appearing.  Lungs: Clear to auscultation and symmetric breath sounds. Heart:  RRR. No murmur or rub. Abdomen: Soft. No hernia. Normal bowel sounds. Incisions look  good.  Discharge Medications:     Medication List    TAKE these medications        amitriptyline 50 MG tablet  Commonly known as:  ELAVIL  Take 50 mg by mouth at bedtime.     buPROPion 300 MG 24 hr tablet  Commonly known as:  WELLBUTRIN XL  Take 300 mg by mouth every morning.  Notes to Patient:  This medication may be too large to swallow and cannot be crushed or cut in half.  Please discuss with prescribing doctor the need to change to an immediate release formula if you are unable to swallow     citalopram 20 MG tablet  Commonly known as:  CELEXA  Take 20 mg by mouth every morning.     cyclobenzaprine 10 MG tablet  Commonly known as:  FLEXERIL  Take 10 mg by mouth 3 (three) times daily as  needed for muscle spasms.     diazepam 5 MG tablet  Commonly known as:  VALIUM  Take 5 mg by mouth 2 (two) times daily.     diclofenac 75 MG EC tablet  Commonly known as:  VOLTAREN  Take 75 mg by mouth every morning.  Notes to Patient:  Avoid NSAIDs for 6-8 weeks after surgery      furosemide 20 MG tablet  Commonly known as:  LASIX  Take 20 mg by mouth every morning.  Notes to Patient:  Monitor Blood Pressure Daily and keep a log for primary care physician.  Monitor for symptoms of dehydration.  You may need to make changes to your medications with rapid weight loss.        HYDROcodone-acetaminophen 7.5-325 MG tablet  Commonly known as:  NORCO  Take 1 tablet by mouth every 6 (six) hours as needed for moderate pain.     ibuprofen 600 MG tablet  Commonly known as:  ADVIL,MOTRIN  Take 600 mg by mouth every 6 (six) hours as needed (Pain).  Notes to Patient:  Avoid NSAIDs for 6-8 weeks after surgery      medroxyPROGESTERone 150 MG/ML injection  Commonly known as:  DEPO-PROVERA  150 mg every 3 (three) months.     traMADol 50 MG tablet  Commonly known as:  ULTRAM  Take 50 mg by mouth 2 (two) times daily as needed (Pain).     zolpidem 10 MG tablet  Commonly known as:  AMBIEN   Take 10 mg by mouth at bedtime.        Disposition: Final discharge disposition not confirmed      Discharge Instructions    Ambulate hourly while awake    Complete by:  As directed      Call MD for:  difficulty breathing, headache or visual disturbances    Complete by:  As directed      Call MD for:  persistant dizziness or light-headedness    Complete by:  As directed      Call MD for:  persistant nausea and vomiting    Complete by:  As directed      Call MD for:  redness, tenderness, or signs of infection (pain, swelling, redness, odor or green/yellow discharge around incision site)    Complete by:  As directed      Call MD for:  severe uncontrolled pain    Complete by:  As directed      Call MD for:  temperature >101 F    Complete by:  As directed      Diet bariatric full liquid    Complete by:  As directed      Incentive spirometry    Complete by:  As directed   Perform hourly while awake           Follow-up Information    Follow up with [facility] H, MD. Go on 08/05/2015.   Specialty:  General Surgery   Why:  For Post-Op Check at 4:20 PM   Contact information:   [address] [place] [address] [phone]       Follow up with [facility] H, MD. Go on 09/02/2015.   Specialty:  General Surgery   Why:  For Post-Op Check at 4:40 PM   Contact information:   [address] [place] [address] [phone]       Return to work on:  08/22/2015  Activity:  Driving - May drive in  3 or 4 days, if doing well and off pain meds   Lifting - Take it easy for one week, then no limit  Wound Care:   May shower  Diet:  Post op gastric bypass  Follow up appointment:  Call Dr. Allene Pyo office [facility]) at [phone] for an appointment in 2 weeks - you already have an appt.  Medications and dosages:  Resume your home medications.  You have a prescription for:  Oxycodone, protonix, and zofran  Signed: Ovidio Kin, M.D.,  [facility] Office:  [phone]  07/22/2015, 7:28 AM

## 2015-07-22 NOTE — Progress Notes (Signed)
Patient alert and oriented. Discharge teaching complete. Pain controlled on oral medications. All questions answered.

## 2015-07-22 NOTE — Discharge Instructions (Signed)
° ° ° °GASTRIC BYPASS/SLEEVE ° Home Care Instructions ° ° These instructions are to help you care for yourself when you go home. ° °Call: If you have any problems. °• Call [phone] and ask for the surgeon on call °• If you need immediate assistance come to the ER at [place]. Tell the ER staff you are a new post-op gastric bypass or gastric sleeve patient  °Signs and symptoms to report: • Severe  vomiting or nausea °o If you cannot handle clear liquids for longer than 1 day, call your surgeon °• Abdominal pain which does not get better after taking your pain medication °• Fever greater than 100.4°  F and chills °• Heart rate over 100 beats a minute °• Trouble breathing °• Chest pain °• Redness,  swelling, drainage, or foul odor at incision (surgical) sites °• If your incisions open or pull apart °• Swelling or pain in calf (lower leg) °• Diarrhea (Loose bowel movements that happen often), frequent watery, uncontrolled bowel movements °• Constipation, (no bowel movements for 3 days) if this happens: °o Take Milk of Magnesia, 2 tablespoons by mouth, 3 times a day for 2 days if needed °o Stop taking Milk of Magnesia once you have had a bowel movement °o Call your doctor if constipation continues °Or °o Take Miralax  (instead of Milk of Magnesia) following the label instructions °o Stop taking Miralax once you have had a bowel movement °o Call your doctor if constipation continues °• Anything you think is “abnormal for you” °  °Normal side effects after surgery: • Unable to sleep at night or unable to concentrate °• Irritability °• Being tearful (crying) or depressed ° °These are common complaints, possibly related to your anesthesia, stress of surgery, and change in lifestyle, that usually go away a few weeks after surgery. If these feelings continue, call your medical doctor.  °Wound Care: You may have surgical glue, steri-strips, or staples over your incisions after surgery °• Surgical glue: Looks like clear  film over your incisions and will wear off a little at a time °• Steri-strips: Adhesive strips of tape over your incisions. You may notice a yellowish color on skin under the steri-strips. This is used to make the steri-strips stick better. Do not pull the steri-strips off - let them fall off °• Staples: Staples may be removed before you leave the hospital °o If you go home with staples, call [facility] for an appointment with your surgeon’s nurse to have staples removed 10 days after surgery, [phone] °• Showering: You may shower two (2) days after your surgery unless your surgeon tells you differently °o Wash gently around incisions with warm soapy water, rinse well, and gently pat dry °o If you have a drain (tube from your incision), you may need someone to hold this while you shower °o No tub baths until staples are removed and incisions are healed °  °Medications: • Medications should be liquid or crushed if larger than the size of a dime °• Extended release pills (medication that releases a little bit at a time through the  day) should not be crushed °• Depending on the size and number of medications you take, you may need to space (take a few throughout the day)/change the time you take your medications so that you do not over-fill your pouch (smaller stomach) °• Make sure you follow-up with you primary care physician to make medication changes needed during rapid weight loss and life -style changes °•   If you have diabetes, follow up with your doctor that orders your diabetes medication(s) within one week after surgery and check your blood sugar regularly ° °• Do not drive while taking narcotics (pain medications) ° °• Do not take acetaminophen (Tylenol) and Roxicet or Lortab Elixir at the same time since these pain medications contain acetaminophen °  °Diet:  °First 2 Weeks You will see the nutritionist about two (2) weeks after your surgery. The nutritionist will increase the types of  foods you can eat if you are handling liquids well: °• If you have severe vomiting or nausea and cannot handle clear liquids lasting longer than 1 day call your surgeon °Protein Shake °• Drink at least 2 ounces of shake 5-6 times per day °• Each serving of protein shakes (usually 8-12 ounces) should have a minimum of: °o 15 grams of protein °o And no more than 5 grams of carbohydrate °• Goal for protein each day: °o Men = 80 grams per day °o Women = 60 grams per day °  ° • Protein powder may be added to fluids such as non-fat milk or Lactaid milk or Soy milk (limit to 35 grams added protein powder per serving) ° °Hydration °• Slowly increase the amount of water and other clear liquids as tolerated (See Acceptable Fluids) °• Slowly increase the amount of protein shake as tolerated °• Sip fluids slowly and throughout the day °• May use sugar substitutes in small amounts (no more than 6-8 packets per day; i.e. Splenda) ° °Fluid Goal °• The first goal is to drink at least 8 ounces of protein shake/drink per day (or as directed by the nutritionist); some examples of protein shakes are Syntrax Nectar, Adkins Advantage, EAS Edge HP, and Unjury. - See handout from pre-op Bariatric Education Class: °o Slowly increase the amount of protein shake you drink as tolerated °o You may find it easier to slowly sip shakes throughout the day °o It is important to get your proteins in first °• Your fluid goal is to drink 64-100 ounces of fluid daily °o It may take a few weeks to build up to this  °• 32 oz. (or more) should be clear liquids °And °• 32 oz. (or more) should be full liquids (see below for examples) °• Liquids should not contain sugar, caffeine, or carbonation ° °Clear Liquids: °• Water of Sugar-free flavored water (i.e. Fruit H²O, Propel) °• Decaffeinated coffee or tea (sugar-free) °• Crystal lite, Wyler’s Lite, Minute Maid Lite °• Sugar-free Jell-O °• Bouillon or broth °• Sugar-free Popsicle:    - Less than 20 calories  each; Limit 1 per day ° °Full Liquids: °                  Protein Shakes/Drinks + 2 choices per day of other full liquids °• Full liquids must be: °o No More Than 12 grams of Carbs per serving °o No More Than 3 grams of Fat per serving °• Strained low-fat cream soup °• Non-Fat milk °• Fat-free Lactaid Milk °• Sugar-free yogurt (Dannon Lite & Fit, Greek yogurt) ° °  °Vitamins and Minerals • Start 1 day after surgery unless otherwise directed by your surgeon °• 2 Chewable Multivitamin / Multimineral Supplement with iron (i.e. Centrum for Adults) °• Vitamin B-12, 350-500 micrograms sub-lingual (place tablet under the tongue) each day °• Chewable Calcium Citrate with Vitamin D-3 °(Example: 3 Chewable Calcium  Plus 600 with Vitamin D-3) °o Take 500 mg three (3) times a day for   a total of 1500 mg each day °o Do not take all 3 doses of calcium at one time as it may cause constipation, and you can only absorb 500 mg at a time °o Do not mix multivitamins containing iron with calcium supplements;  take 2 hours apart °o Do not substitute Tums (calcium carbonate) for your calcium °• Menstruating women and those at risk for anemia ( a blood disease that causes weakness) may need extra iron °o Talk to your doctor to see if you need more iron °• If you need extra iron: Total daily Iron recommendation (including Vitamins) is 50 to 100 mg Iron/day °• Do not stop taking or change any vitamins or minerals until you talk to your nutritionist or surgeon °• Your nutritionist and/or surgeon must approve all vitamin and mineral supplements °  °Activity and Exercise: It is important to continue walking at home. Limit your physical activity as instructed by your doctor. During this time, use these guidelines: °• Do not lift anything greater than ten  (10) pounds for at least two (2) weeks °• Do not go back to work or drive until your surgeon says you can °• You may have sex when you feel comfortable °o It is VERY important for female  patients to use a reliable birth control method; fertility often increase after surgery °o Do not get pregnant for at least 18 months °• Start exercising as soon as your doctor tells you that you can °o Make sure your doctor approves any physical activity °• Start with a simple walking program °• Walk 5-15 minutes each day, 7 days per week °• Slowly increase until you are walking 30-45 minutes per day °• Consider joining our BELT program. [phone] or email [email or web address] °  °Special Instructions Things to remember: °• Free counseling is available for you and your family through collaboration between [facility] and [employer]. Please call [phone] and leave a message °• Use your CPAP when sleeping if this applies to you °• Consider buying a medical alert bracelet that says you had lap-band surgery °  °  You will likely have your first fill (fluid added to your band) 6 - 8 weeks after surgery °• [facility] has a free Bariatric Surgery Support Group that meets monthly, the 3rd Thursday, 6pm. [facility] Classrooms. You can see classes online at [email or web address] °• It is very important to keep all follow up appointments with your surgeon, nutritionist, primary care physician, and behavioral health practitioner °o After the first year, please follow up with your bariatric surgeon and nutritionist at least once a year in order to maintain best weight loss results °      °             [facility]:  [phone] ° °             [facility]: [phone] ° °             Bariatric Nurse Coordinator: [phone]  °Gastric Bypass/Sleeve Home Care Instructions  Rev. 05/2012    ° °                                                    Reviewed and Endorsed °                                                     by [facility] Patient Education Committee, Jan, 2014    [facility] - DISCHARGE INSTRUCTIONS TO PATIENT  Return  to work on:  08/22/2015  Activity:  Driving - May drive in 3 or 4 days, if doing well and off pain meds   Lifting - Take it easy for one week, then no limit  Wound Care:   May shower  Diet:  Post op gastric bypass  Follow up appointment:  Call Dr. Allene PyoNewman's office [facility]) at [phone] for an appointment in 2 weeks - you already have an appt.  Medications and dosages:  Resume your home medications.  You have a prescription for:  Oxycodone, protonix, and zofran  Call Dr. Ezzard StandingNewman or his office  [phone]) if you have:  Temperature greater than 100.4,  Persistent nausea and vomiting,  Severe uncontrolled pain,  Redness, tenderness, or signs of infection (pain, swelling, redness, odor or green/yellow discharge around the site),  Difficulty breathing, headache or visual disturbances,  Any other questions or concerns you may have after discharge.  In an emergency, call 911 or go to an Emergency Department at a nearby hospital.

## 2015-08-02 ENCOUNTER — Telehealth (HOSPITAL_COMMUNITY): Payer: Self-pay

## 2015-08-02 NOTE — Telephone Encounter (Signed)
Made discharge phone call to patient per DROP protocol. Asking the following questions.    1. Do you have someone to care for you now that you are home?  yes 2. Are you having pain now that is not relieved by your pain medication?  yes 3. Are you able to drink the recommended daily amount of fluids (48 ounces minimum/day) and protein (60-80 grams/day) as prescribed by the dietitian or nutritional counselor?  yes 4. Are you taking the vitamins and minerals as prescribed?  yes 5. Do you have the "on call" number to contact your surgeon if you have a problem or question?  yes 6. Are your incisions free of redness, swelling or drainage? (If steri strips, address that these can fall off, shower as tolerated) yes 7. Have your bowels moved since your surgery?  If not, are you passing gas?  yes 8. Are you up and walking 3-4 times per day?  yes       

## 2015-08-05 ENCOUNTER — Encounter: Payer: BLUE CROSS/BLUE SHIELD | Attending: Surgery | Admitting: Dietician

## 2015-08-05 ENCOUNTER — Encounter: Payer: Self-pay | Admitting: Dietician

## 2015-08-05 NOTE — Progress Notes (Signed)
  Follow-up visit:  2.5 Weeks Post-Operative RYGB Surgery  Medical Nutrition Therapy:  Appt start time: 1100 end time:  1150.  Primary concerns today: Post-operative Bariatric Surgery Nutrition Management. Pam Collins returns having lost a total of 33 lbs. She reports she has been eating "everything." Tolerates all protein foods (eggs, seafood, cream soups) as well as vegetables. She has also tried lasagna, garlic bread, french fries, and a hot fudge sundae. She states that she did not like the way it felt going down but it did not have any vomiting, diarrhea, or dumping syndrome. Vomited 1x with sardines and had diarrhea with french fries and Polynesian sauce from [employer]. If she does not feel like eating she may drink up to 3 Premier protein shakes per day. Pam Collins reports "less warning" when she has to have a bowel movement.   Surgery date: 07/19/2015 Surgery type: RYGB [facility] Weight today: 357.3 lbs Weight change: 17.1 lbs Total weight: 32.7 lbs  Preferred Learning Style:   No preference indicated   Learning Readiness:   Ready  24-hr recall:  Patient states that she usually eats a protein food (or shake) 5-6 times a day. Can tolerate about 2 oz meat at a time.  Fluid intake: protein shakes (22-33 oz), unsweet decaf tea, 32 oz water, sugar free jello and popsicles Estimated total protein intake: 60+ grams per day  Medications: see list Supplementation: not taking  Using straws: no but sometimes forgets Drinking while eating: sometimes sips Hair loss: no Carbonated beverages: sips of husband's ginger ale and coke, caused discomfort N/V/D/C: vomiting 1x; diarrhea with french fries and polynesian sauce from [employer] Dumping syndrome: unknown  Recent physical activity:  Plans to start weight training; 30-60 minutes on treadmill 2x in the past week  Progress Towards Goal(s):  In progress.  Handouts given during visit include:  Phase 3A lean  proteins   Nutritional Diagnosis:  Nisswa-3.3 Overweight/obesity related to past poor dietary habits and physical inactivity as evidenced by patient w/ recent RYGB surgery following dietary guidelines for continued weight loss.     Intervention:  Nutrition counseling provided. Stressed the importance of vitamin/mineral supplement compliance. Also reviewed the diet recommendations and explained reasons why proper diet is important for weight loss and overall short term and long term nutrition status.   Teaching Method Utilized:  Visual Auditory Hands on  Barriers to learning/adherence to lifestyle change: ability to tolerate high sugar/fat foods  Demonstrated degree of understanding via:  Teach Back   Monitoring/Evaluation:  Dietary intake, exercise, and body weight. Follow up in 3 months for 5.5 month post-op visit.

## 2015-08-05 NOTE — Patient Instructions (Addendum)
Goals:  Follow Phase 3A: Soft High Protein Phase  Eat 3-6 small meals/snacks, every 3-5 hrs  Increase lean protein foods to meet 60g goal  Increase fluid intake to 64oz +  Avoid drinking 15 minutes before, during and 30 minutes after eating  Aim for >30 min of physical activity daily per MD  Work on getting back into taking multivitamins (take 1st calcium with morning protein shake and then 1st multivitamin with lunch)  Consider seeing a counselor about emotional eating  Surgery date: 07/19/2015 Surgery type: RYGB [facility] Weight today: 357.3 lbs Weight change: 17.1 lbs Total weight: 32.7 lbs

## 2016-05-07 IMAGING — RF DG UGI W/ GASTROGRAFIN
11 of 13 series · 15 of 24 positions shown · IV contrast (agent unspecified)
Comparison: 06/04/2015

CLINICAL DATA: 29-year-old female post gastric bypass. Initial
encounter.

EXAM:
WATER SOLUBLE UPPER GI SERIES
TECHNIQUE: Single-column upper GI series was performed using water soluble
contrast.
CONTRAST:  50 cc Gastroview

[Series 1: t abdomen supine · 0.15mm/px · 1 of 1 slices shown]
[im 1/1]
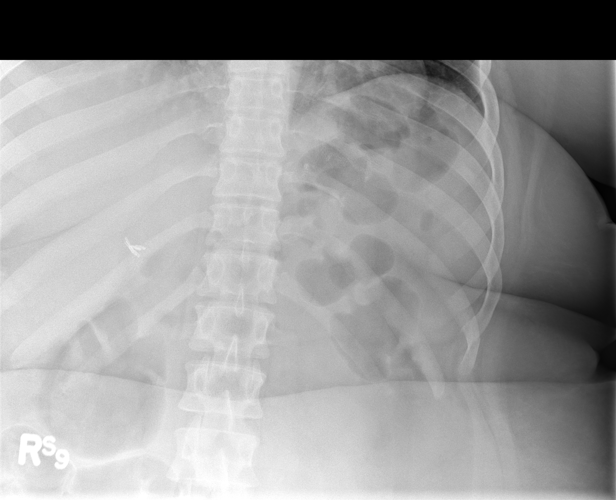

[Series 3: fluoro_barium 2fps_bw · 0.17mm/px · 1 of 1 slices shown (1 of 10)]
[im 1/1]
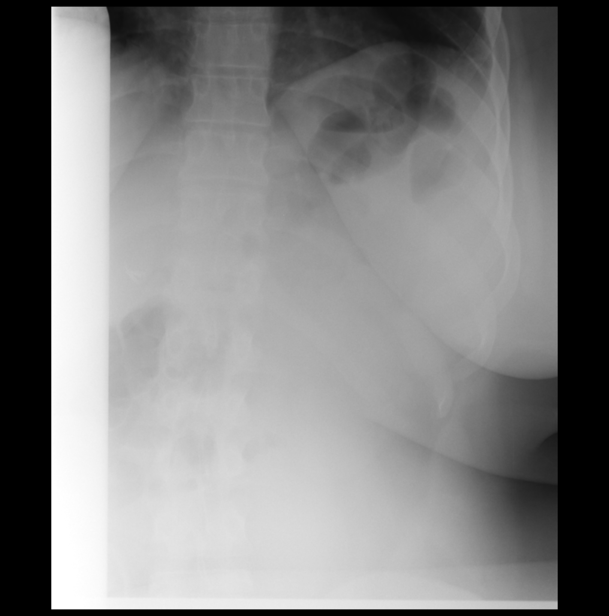

[Series 5: fluoro_barium 2fps_bw · 0.17mm/px · 2 of 4 frames shown (2 of 10)]
[frame 1/4]
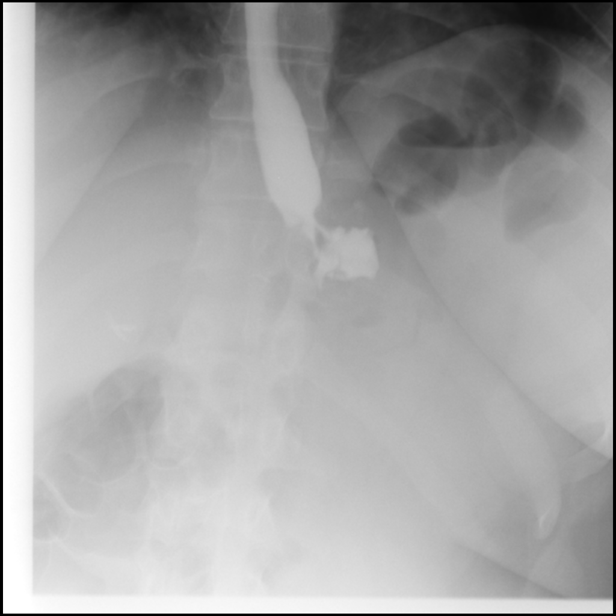
[frame 3/4]
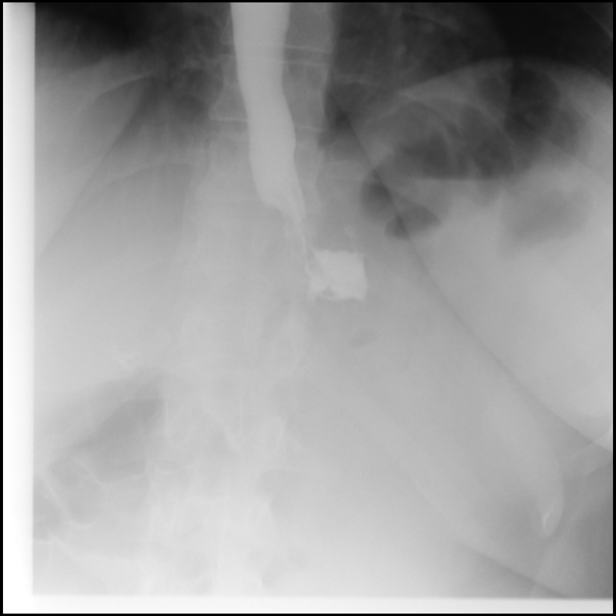

[Series 6: fluoro_barium 2fps_bw · 0.17mm/px · 2 of 6 frames shown (3 of 10)]
[frame 1/6]
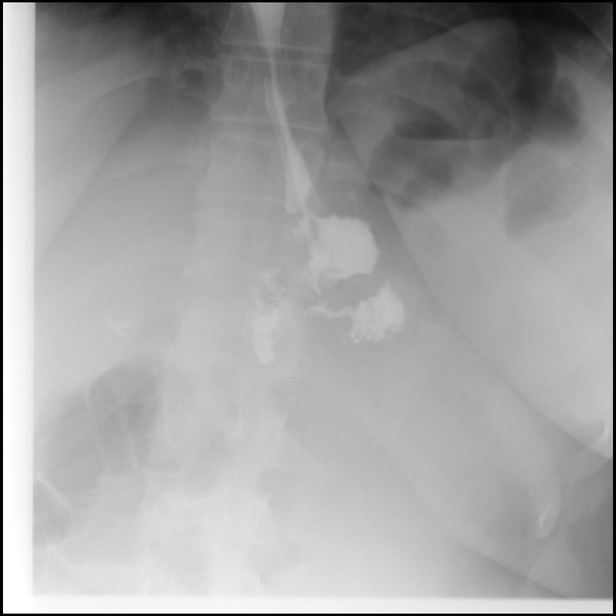
[frame 4/6]
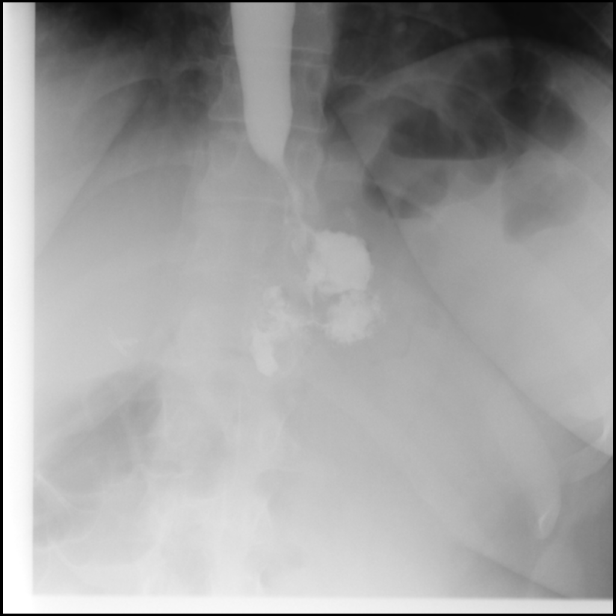

[Series 7: fluoro_barium 2fps_bw · 0.17mm/px · 1 of 2 frames shown (4 of 10)]
[frame 2/2]
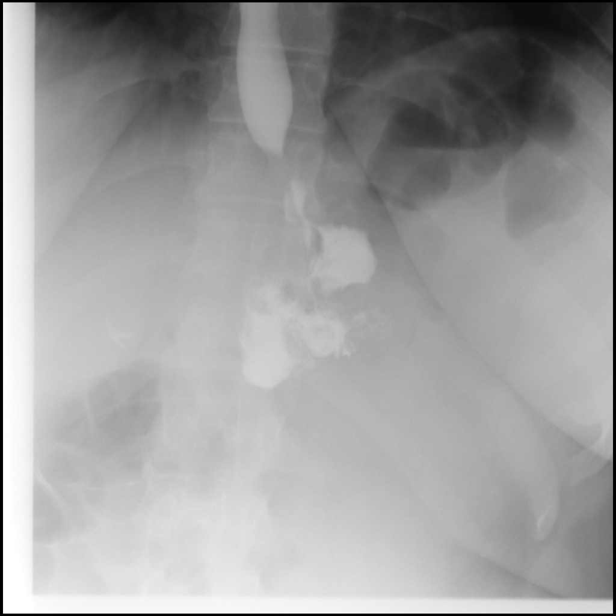

[Series 8: fluoro_barium 2fps_bw · 0.17mm/px · 2 of 8 frames shown (5 of 10)]
[frame 4/8]
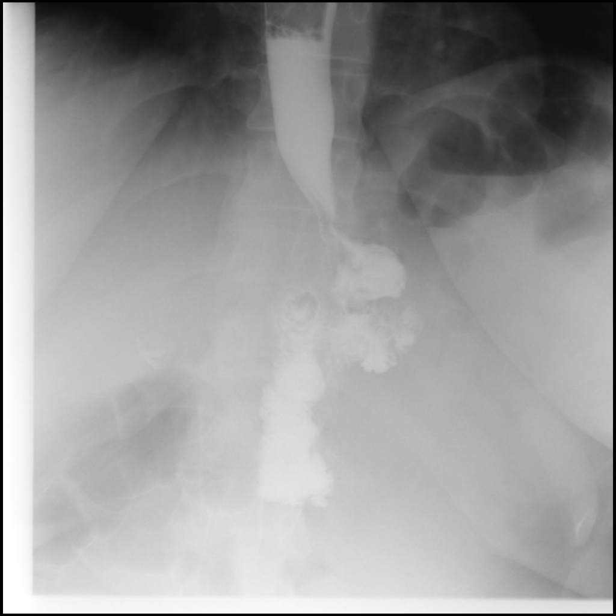
[frame 5/8]
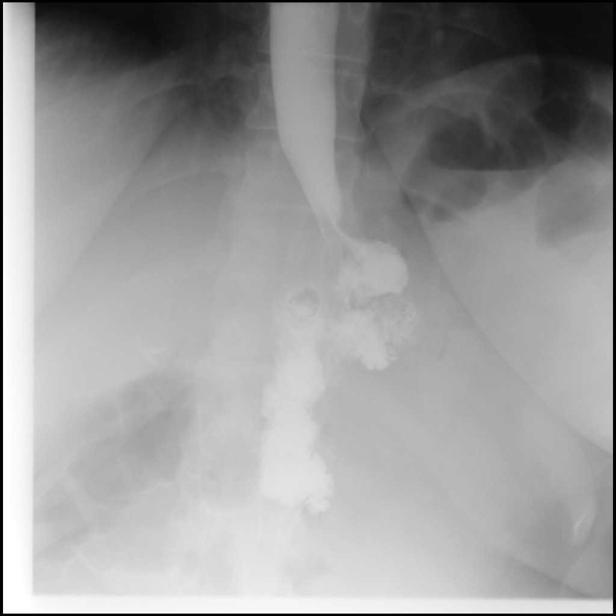

[Series 9: fluoro_barium 2fps_bw · 0.17mm/px · 2 of 4 frames shown (6 of 10)]
[frame 3/4]
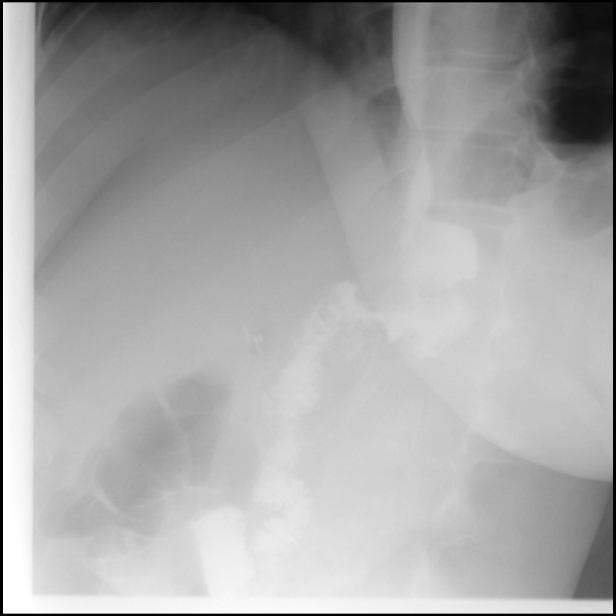
[frame 4/4]
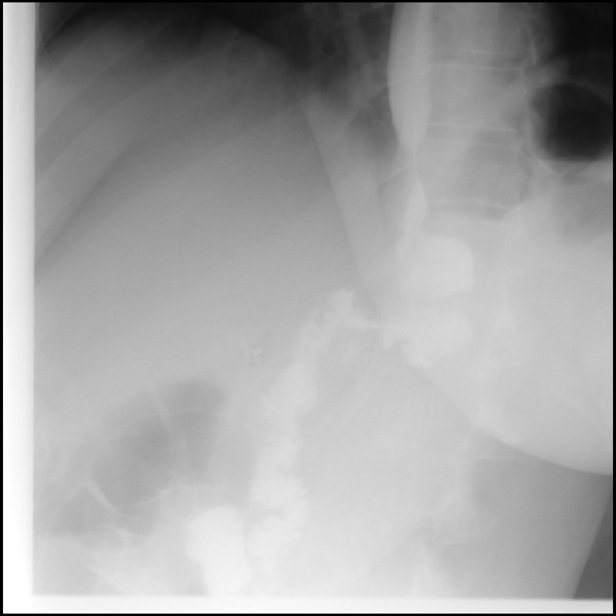

[Series 10: fluoro_barium 2fps_bw · 0.17mm/px · 1 of 4 frames shown (7 of 10)]
[frame 3/4]
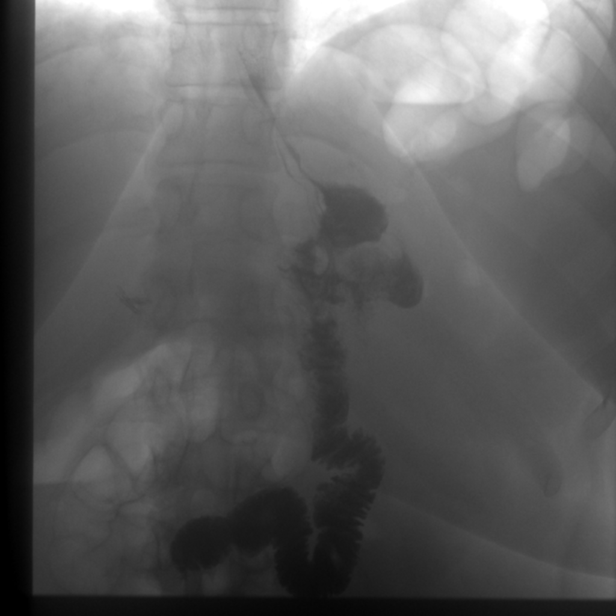

[Series 11: fluoro_barium 2fps_bw · 0.17mm/px · 1 of 1 slices shown (8 of 10)]
[im 1/1]
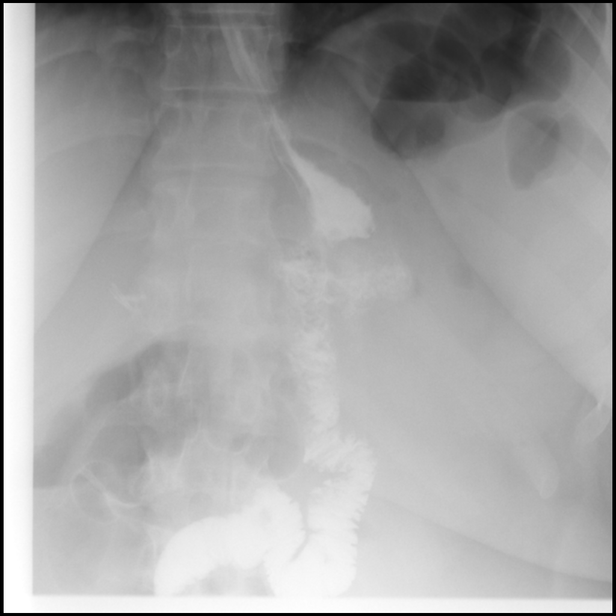

[Series 12: fluoro_barium 2fps_bw · 0.17mm/px · 1 of 3 frames shown (9 of 10)]
[frame 2/3]
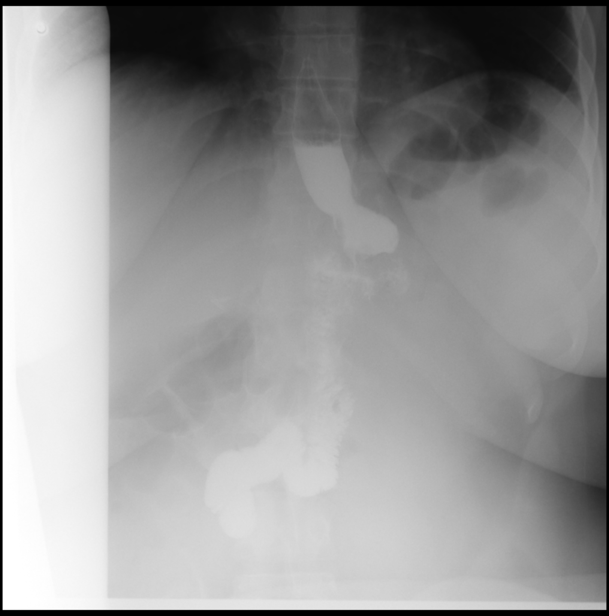

[Series 13: fluoro_barium 2fps_bw · 0.17mm/px · 1 of 1 slices shown (10 of 10)]
[im 1/1]
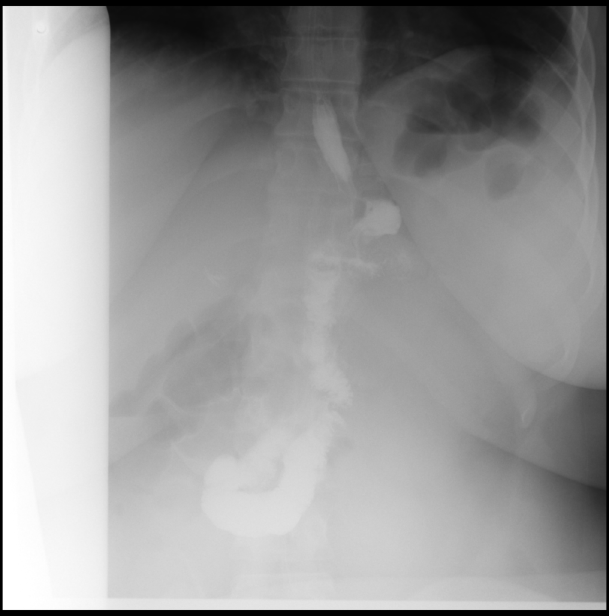

[15 of 24 positions shown; findings below may reference images not displayed]

FLUOROSCOPY TIME:  Radiation Exposure Index (as provided by the
fluoroscopic device): 258.3 micro Gray

Fluoroscopy Time:  42 seconds.
FINDINGS: Post gastric bypass procedure. No leak identified. No delayed
emptying.
IMPRESSION: Post gastric bypass procedure.

No leak identified.

No delayed emptying.

## 2016-05-22 ENCOUNTER — Encounter (HOSPITAL_COMMUNITY): Payer: Self-pay

## 2017-02-21 ENCOUNTER — Encounter (HOSPITAL_COMMUNITY): Payer: Self-pay

## 2017-03-16 ENCOUNTER — Telehealth (HOSPITAL_COMMUNITY): Payer: Self-pay

## 2017-03-16 NOTE — Telephone Encounter (Signed)
Returned mail; marked undeliverable/unable to forward: This patient is overdue for recommended follow-up with [facility]. A letter was mailed to the address on file 11.21.18  from both [place] & [facility] in attempt to reestablish post-op care. Letter has been returned to [employer] marked undeliverable, unable to forward. No additional [facility] or [employer]. No response yet from e-mail sent to patient at time of letter. 2nd e-mail attempt to reach patient sent today. Information was shared with Dario GuardianFrances Jackson today at [facility] so she may contact the patient via phone again in attempt to get the patient scheduled for an appointment in their office.
